# Patient Record
Sex: Female | Born: 1935 | Race: White | Hispanic: No | Marital: Married | Smoking: Former smoker
Health system: Southern US, Community
[De-identification: ages and names within clinical notes are randomized; demographics above are authoritative.]

## PROBLEM LIST (undated history)

## (undated) DIAGNOSIS — R413 Other amnesia: Secondary | ICD-10-CM

## (undated) DIAGNOSIS — I2510 Atherosclerotic heart disease of native coronary artery without angina pectoris: Secondary | ICD-10-CM

## (undated) DIAGNOSIS — F329 Major depressive disorder, single episode, unspecified: Secondary | ICD-10-CM

## (undated) DIAGNOSIS — H905 Unspecified sensorineural hearing loss: Secondary | ICD-10-CM

## (undated) DIAGNOSIS — L219 Seborrheic dermatitis, unspecified: Secondary | ICD-10-CM

## (undated) DIAGNOSIS — C50519 Malignant neoplasm of lower-outer quadrant of unspecified female breast: Secondary | ICD-10-CM

## (undated) DIAGNOSIS — I4891 Unspecified atrial fibrillation: Secondary | ICD-10-CM

## (undated) DIAGNOSIS — F32A Depression, unspecified: Secondary | ICD-10-CM

## (undated) DIAGNOSIS — E785 Hyperlipidemia, unspecified: Secondary | ICD-10-CM

## (undated) DIAGNOSIS — M159 Polyosteoarthritis, unspecified: Secondary | ICD-10-CM

## (undated) DIAGNOSIS — R350 Frequency of micturition: Secondary | ICD-10-CM

## (undated) DIAGNOSIS — G4700 Insomnia, unspecified: Secondary | ICD-10-CM

## (undated) DIAGNOSIS — G459 Transient cerebral ischemic attack, unspecified: Secondary | ICD-10-CM

## (undated) DIAGNOSIS — I10 Essential (primary) hypertension: Secondary | ICD-10-CM

## (undated) DIAGNOSIS — I219 Acute myocardial infarction, unspecified: Secondary | ICD-10-CM

## (undated) DIAGNOSIS — R32 Unspecified urinary incontinence: Secondary | ICD-10-CM

## (undated) DIAGNOSIS — G5600 Carpal tunnel syndrome, unspecified upper limb: Secondary | ICD-10-CM

## (undated) DIAGNOSIS — F419 Anxiety disorder, unspecified: Secondary | ICD-10-CM

## (undated) DIAGNOSIS — F22 Delusional disorders: Secondary | ICD-10-CM

## (undated) DIAGNOSIS — F0390 Unspecified dementia without behavioral disturbance: Secondary | ICD-10-CM

## (undated) DIAGNOSIS — C801 Malignant (primary) neoplasm, unspecified: Secondary | ICD-10-CM

## (undated) DIAGNOSIS — M810 Age-related osteoporosis without current pathological fracture: Secondary | ICD-10-CM

## (undated) DIAGNOSIS — R5383 Other fatigue: Secondary | ICD-10-CM

## (undated) DIAGNOSIS — R5381 Other malaise: Secondary | ICD-10-CM

## (undated) HISTORY — DX: Unspecified urinary incontinence: R32

## (undated) HISTORY — DX: Transient cerebral ischemic attack, unspecified: G45.9

## (undated) HISTORY — DX: Delusional disorders: F22

## (undated) HISTORY — PX: WRIST FRACTURE SURGERY: SHX121

## (undated) HISTORY — DX: Polyosteoarthritis, unspecified: M15.9

## (undated) HISTORY — DX: Other malaise: R53.81

## (undated) HISTORY — DX: Age-related osteoporosis without current pathological fracture: M81.0

## (undated) HISTORY — DX: Malignant neoplasm of lower-outer quadrant of unspecified female breast: C50.519

## (undated) HISTORY — DX: Seborrheic dermatitis, unspecified: L21.9

## (undated) HISTORY — DX: Acute myocardial infarction, unspecified: I21.9

## (undated) HISTORY — DX: Other amnesia: R41.3

## (undated) HISTORY — DX: Hyperlipidemia, unspecified: E78.5

## (undated) HISTORY — DX: Anxiety disorder, unspecified: F41.9

## (undated) HISTORY — DX: Unspecified sensorineural hearing loss: H90.5

## (undated) HISTORY — DX: Carpal tunnel syndrome, unspecified upper limb: G56.00

## (undated) HISTORY — DX: Frequency of micturition: R35.0

## (undated) HISTORY — DX: Depression, unspecified: F32.A

## (undated) HISTORY — DX: Major depressive disorder, single episode, unspecified: F32.9

## (undated) HISTORY — DX: Malignant (primary) neoplasm, unspecified: C80.1

## (undated) HISTORY — DX: Insomnia, unspecified: G47.00

## (undated) HISTORY — DX: Other fatigue: R53.83

## (undated) HISTORY — DX: Atherosclerotic heart disease of native coronary artery without angina pectoris: I25.10

---

## 1996-09-16 DIAGNOSIS — C50519 Malignant neoplasm of lower-outer quadrant of unspecified female breast: Secondary | ICD-10-CM

## 1996-09-16 HISTORY — DX: Malignant neoplasm of lower-outer quadrant of unspecified female breast: C50.519

## 1996-09-16 HISTORY — PX: MASTECTOMY: SHX3

## 1998-08-02 ENCOUNTER — Other Ambulatory Visit: Admission: RE | Admit: 1998-08-02 | Discharge: 1998-08-02 | Payer: Self-pay | Admitting: Emergency Medicine

## 1999-07-13 ENCOUNTER — Other Ambulatory Visit: Admission: RE | Admit: 1999-07-13 | Discharge: 1999-07-13 | Payer: Self-pay | Admitting: Emergency Medicine

## 2000-08-01 ENCOUNTER — Other Ambulatory Visit: Admission: RE | Admit: 2000-08-01 | Discharge: 2000-08-01 | Payer: Self-pay | Admitting: Emergency Medicine

## 2001-07-27 ENCOUNTER — Other Ambulatory Visit: Admission: RE | Admit: 2001-07-27 | Discharge: 2001-07-27 | Payer: Self-pay | Admitting: Emergency Medicine

## 2002-10-01 ENCOUNTER — Ambulatory Visit (HOSPITAL_COMMUNITY): Admission: RE | Admit: 2002-10-01 | Discharge: 2002-10-01 | Payer: Self-pay | Admitting: Emergency Medicine

## 2002-10-01 ENCOUNTER — Encounter: Payer: Self-pay | Admitting: Emergency Medicine

## 2002-11-08 ENCOUNTER — Encounter: Admission: RE | Admit: 2002-11-08 | Discharge: 2003-02-06 | Payer: Self-pay | Admitting: Emergency Medicine

## 2003-01-15 HISTORY — PX: CORONARY ARTERY BYPASS GRAFT: SHX141

## 2003-02-02 ENCOUNTER — Encounter: Payer: Self-pay | Admitting: Emergency Medicine

## 2003-02-02 ENCOUNTER — Inpatient Hospital Stay (HOSPITAL_COMMUNITY): Admission: EM | Admit: 2003-02-02 | Discharge: 2003-02-16 | Payer: Self-pay | Admitting: Emergency Medicine

## 2003-02-02 ENCOUNTER — Encounter: Payer: Self-pay | Admitting: Cardiology

## 2003-02-03 ENCOUNTER — Encounter: Payer: Self-pay | Admitting: Thoracic Surgery (Cardiothoracic Vascular Surgery)

## 2003-02-08 ENCOUNTER — Encounter: Payer: Self-pay | Admitting: Thoracic Surgery (Cardiothoracic Vascular Surgery)

## 2003-02-09 ENCOUNTER — Encounter: Payer: Self-pay | Admitting: Thoracic Surgery (Cardiothoracic Vascular Surgery)

## 2003-02-10 ENCOUNTER — Encounter: Payer: Self-pay | Admitting: Thoracic Surgery (Cardiothoracic Vascular Surgery)

## 2003-03-07 ENCOUNTER — Encounter (HOSPITAL_COMMUNITY): Admission: RE | Admit: 2003-03-07 | Discharge: 2003-06-05 | Payer: Self-pay | Admitting: Emergency Medicine

## 2003-05-20 ENCOUNTER — Encounter: Admission: RE | Admit: 2003-05-20 | Discharge: 2003-05-20 | Payer: Self-pay | Admitting: Emergency Medicine

## 2003-05-20 ENCOUNTER — Encounter: Payer: Self-pay | Admitting: Emergency Medicine

## 2003-06-06 ENCOUNTER — Encounter (HOSPITAL_COMMUNITY): Admission: RE | Admit: 2003-06-06 | Discharge: 2003-07-01 | Payer: Self-pay | Admitting: Cardiology

## 2003-06-12 ENCOUNTER — Observation Stay (HOSPITAL_COMMUNITY): Admission: EM | Admit: 2003-06-12 | Discharge: 2003-06-13 | Payer: Self-pay | Admitting: Emergency Medicine

## 2003-06-13 ENCOUNTER — Encounter: Payer: Self-pay | Admitting: Cardiology

## 2003-07-04 ENCOUNTER — Encounter (HOSPITAL_COMMUNITY): Admission: RE | Admit: 2003-07-04 | Discharge: 2003-10-02 | Payer: Self-pay | Admitting: Cardiology

## 2003-10-18 ENCOUNTER — Encounter (HOSPITAL_COMMUNITY): Admission: RE | Admit: 2003-10-18 | Discharge: 2004-01-16 | Payer: Self-pay | Admitting: Cardiology

## 2003-12-05 ENCOUNTER — Encounter: Admission: RE | Admit: 2003-12-05 | Discharge: 2003-12-05 | Payer: Self-pay | Admitting: Emergency Medicine

## 2003-12-19 ENCOUNTER — Encounter: Admission: RE | Admit: 2003-12-19 | Discharge: 2003-12-19 | Payer: Self-pay | Admitting: General Surgery

## 2003-12-22 ENCOUNTER — Encounter (INDEPENDENT_AMBULATORY_CARE_PROVIDER_SITE_OTHER): Payer: Self-pay | Admitting: *Deleted

## 2003-12-22 ENCOUNTER — Ambulatory Visit (HOSPITAL_BASED_OUTPATIENT_CLINIC_OR_DEPARTMENT_OTHER): Admission: RE | Admit: 2003-12-22 | Discharge: 2003-12-22 | Payer: Self-pay | Admitting: General Surgery

## 2003-12-22 ENCOUNTER — Ambulatory Visit (HOSPITAL_COMMUNITY): Admission: RE | Admit: 2003-12-22 | Discharge: 2003-12-22 | Payer: Self-pay | Admitting: General Surgery

## 2004-01-17 ENCOUNTER — Encounter (HOSPITAL_COMMUNITY): Admission: RE | Admit: 2004-01-17 | Discharge: 2004-04-16 | Payer: Self-pay | Admitting: Cardiology

## 2004-03-21 ENCOUNTER — Encounter (INDEPENDENT_AMBULATORY_CARE_PROVIDER_SITE_OTHER): Payer: Self-pay | Admitting: Cardiology

## 2004-03-21 ENCOUNTER — Ambulatory Visit (HOSPITAL_COMMUNITY): Admission: RE | Admit: 2004-03-21 | Discharge: 2004-03-21 | Payer: Self-pay | Admitting: Cardiology

## 2004-04-17 ENCOUNTER — Encounter (HOSPITAL_COMMUNITY): Admission: RE | Admit: 2004-04-17 | Discharge: 2004-07-16 | Payer: Self-pay | Admitting: Cardiology

## 2004-07-17 ENCOUNTER — Encounter (HOSPITAL_COMMUNITY): Admission: RE | Admit: 2004-07-17 | Discharge: 2004-10-15 | Payer: Self-pay | Admitting: Cardiology

## 2004-08-08 ENCOUNTER — Encounter: Admission: RE | Admit: 2004-08-08 | Discharge: 2004-08-08 | Payer: Self-pay | Admitting: Emergency Medicine

## 2004-09-16 HISTORY — PX: KNEE ARTHROSCOPY: SHX127

## 2004-10-17 ENCOUNTER — Encounter (HOSPITAL_COMMUNITY): Admission: RE | Admit: 2004-10-17 | Discharge: 2005-01-15 | Payer: Self-pay | Admitting: Cardiology

## 2004-12-20 ENCOUNTER — Encounter: Admission: RE | Admit: 2004-12-20 | Discharge: 2004-12-20 | Payer: Self-pay | Admitting: Orthopedic Surgery

## 2004-12-21 ENCOUNTER — Ambulatory Visit (HOSPITAL_BASED_OUTPATIENT_CLINIC_OR_DEPARTMENT_OTHER): Admission: RE | Admit: 2004-12-21 | Discharge: 2004-12-21 | Payer: Self-pay | Admitting: Orthopedic Surgery

## 2004-12-21 ENCOUNTER — Ambulatory Visit (HOSPITAL_COMMUNITY): Admission: RE | Admit: 2004-12-21 | Discharge: 2004-12-21 | Payer: Self-pay | Admitting: Orthopedic Surgery

## 2004-12-25 ENCOUNTER — Ambulatory Visit (HOSPITAL_COMMUNITY): Admission: RE | Admit: 2004-12-25 | Discharge: 2004-12-25 | Payer: Self-pay | Admitting: Professional

## 2005-04-16 ENCOUNTER — Encounter (HOSPITAL_COMMUNITY): Admission: RE | Admit: 2005-04-16 | Discharge: 2005-07-15 | Payer: Self-pay | Admitting: Cardiology

## 2005-04-30 ENCOUNTER — Encounter: Admission: RE | Admit: 2005-04-30 | Discharge: 2005-04-30 | Payer: Self-pay | Admitting: Emergency Medicine

## 2005-07-17 ENCOUNTER — Encounter (HOSPITAL_COMMUNITY): Admission: RE | Admit: 2005-07-17 | Discharge: 2005-10-15 | Payer: Self-pay | Admitting: Cardiology

## 2005-10-17 ENCOUNTER — Encounter (HOSPITAL_COMMUNITY): Admission: RE | Admit: 2005-10-17 | Discharge: 2006-01-15 | Payer: Self-pay | Admitting: Cardiology

## 2006-01-16 ENCOUNTER — Encounter (HOSPITAL_COMMUNITY): Admission: RE | Admit: 2006-01-16 | Discharge: 2006-04-16 | Payer: Self-pay | Admitting: Cardiology

## 2006-04-17 ENCOUNTER — Encounter (HOSPITAL_COMMUNITY): Admission: RE | Admit: 2006-04-17 | Discharge: 2006-07-16 | Payer: Self-pay | Admitting: Cardiology

## 2006-09-16 HISTORY — PX: TOTAL HIP ARTHROPLASTY: SHX124

## 2006-10-27 ENCOUNTER — Inpatient Hospital Stay (HOSPITAL_COMMUNITY): Admission: RE | Admit: 2006-10-27 | Discharge: 2006-10-30 | Payer: Self-pay | Admitting: Orthopedic Surgery

## 2007-01-16 ENCOUNTER — Encounter: Admission: RE | Admit: 2007-01-16 | Discharge: 2007-01-16 | Payer: Self-pay | Admitting: Emergency Medicine

## 2008-01-26 ENCOUNTER — Encounter: Admission: RE | Admit: 2008-01-26 | Discharge: 2008-01-26 | Payer: Self-pay | Admitting: Orthopedic Surgery

## 2008-01-27 ENCOUNTER — Ambulatory Visit (HOSPITAL_BASED_OUTPATIENT_CLINIC_OR_DEPARTMENT_OTHER): Admission: RE | Admit: 2008-01-27 | Discharge: 2008-01-27 | Payer: Self-pay | Admitting: Orthopedic Surgery

## 2008-01-29 ENCOUNTER — Inpatient Hospital Stay (HOSPITAL_COMMUNITY): Admission: EM | Admit: 2008-01-29 | Discharge: 2008-01-30 | Payer: Self-pay | Admitting: Emergency Medicine

## 2008-01-29 ENCOUNTER — Encounter (INDEPENDENT_AMBULATORY_CARE_PROVIDER_SITE_OTHER): Payer: Self-pay | Admitting: Internal Medicine

## 2008-06-07 LAB — HM DEXA SCAN

## 2010-09-16 DIAGNOSIS — R413 Other amnesia: Secondary | ICD-10-CM

## 2010-09-16 DIAGNOSIS — I219 Acute myocardial infarction, unspecified: Secondary | ICD-10-CM

## 2010-09-16 HISTORY — DX: Acute myocardial infarction, unspecified: I21.9

## 2010-09-16 HISTORY — DX: Other amnesia: R41.3

## 2010-10-06 ENCOUNTER — Encounter: Payer: Self-pay | Admitting: Family Medicine

## 2010-12-17 LAB — HM MAMMOGRAPHY: HM Mammogram: NEGATIVE

## 2011-01-29 NOTE — Op Note (Signed)
[name], [name]               ACCOUNT NO.:  [id number]   MEDICAL RECORD NO.:  [id number]          PATIENT TYPE:  AMB   LOCATION:  DSC                          FACILITY:  [facility]   PHYSICIAN:  Artist Pais. Weingold, M.D.DATE OF BIRTH:  Nov 27, 1922   DATE OF PROCEDURE:  DATE OF DISCHARGE:                               OPERATIVE REPORT   PREOPERATIVE DIAGNOSIS:  Displaced intra-articular fracture, right  distal radius with mild carpal tunnel syndrome.   POSTOPERATIVE DIAGNOSIS:  Displaced intra-articular fracture, right  distal radius with mild carpal tunnel syndrome.   PROCEDURE:  Open reduction and internal fixation above using DVR plate  and screws and carpal tunnel release.   SURGEON:  Artist Pais. Mina Marble, M.D.   ASSISTANT:  None.   ANESTHESIA:  Axial block.   TOURNIQUET TIME:  43 minutes.   COMPLICATIONS:  None.   DRAINS:  None.   OPERATIVE PROCEDURE:  The patient was taken to the operating suite,  after induction of adequate axial block analgesia and mild IV sedation,  the right upper extremity was prepped and draped in usual sterile  fashion.  An Esmarch was used to exsanguinate limb and a tourniquet was  inflated to 250 mmHg.  At this point in time, an incision was made over  the palmar aspect of the distal forearm and wrist area over the flexor  carpi radialis tendon.  Skin was incised.  The sheath overlying the FCR  was incised.  The FCR extracted in the midline, radial artery and venae  comitantes to the lateral side.  Interval was developed down the level  of pronator quadratus.  Pronator quadratus subperiosteally stripped off  the distal radius using a 15 blade exposing the fracture site.  The  first dorsal compartment and brachial radialis were carefully released  off the radial aspect of the shaft and distal fragment.  Reduction was  then performed using manual traction, flexion, and ulnar deviation.  Once this was done, the DVR plate was fastened to the volar  aspect of  the distal radius through the slotted hole.  Intraoperative fluoroscopy  revealed adequate reduction both the AP lateral and oblique view.  The  remaining cortical screws were placed proximally followed by smooth pegs  distally.  At the end of procedure, intraoperative fluoroscopy revealed  adequate reduction both the AP lateral and oblique view.  The proximal  aspect of carpal tunnel was then carefully identified.  A Freer elevator  was then used to determine the depth of the carpal tunnel.  This was  then followed by placement of a Senn retractor in the apex of the wound  lifting up the palm of the hand.  The transverse carpal wound as then  divided under direct vision, just decompressing the nerve, the wound was  irrigated thoroughly.  Hemostasis  achieved with bipolar cautery and the wound was then loosely closed in  layers of 0 Vicryl to repair the pronator quadratus followed by 3-0  Prolene subcuticular stitch on the skin.  Steri-Strips, 4 x 4s, fluffs  and a volar splint was applied.  The patient tolerated the procedures  well, went to recovery room in stable fashion.      Artist Pais Mina Marble, M.D.  Electronically Signed     MAW/MEDQ  D:  01/27/2008  T:  01/28/2008  Job:  [id number]

## 2011-01-29 NOTE — Discharge Summary (Signed)
[name], [name]               ACCOUNT NO.:  [id number]   MEDICAL RECORD NO.:  [id number]          PATIENT TYPE:  INP   LOCATION:  4702                         FACILITY:  [facility]   PHYSICIAN:  Eduard Clos, MDDATE OF BIRTH:  September 24, 1922   DATE OF ADMISSION:  01/29/2008  DATE OF DISCHARGE:  01/30/2008                               DISCHARGE SUMMARY   This is an 77 year old female with known history of CAD status post  CABG, asthma, atrial fibrillation, dyslipidemia, hypertension who had  recent right radius ORIF presented with palpitation.  The patient was  found to have atrial fibrillation with rapid ventricular rate was  admitted  to telemetry floor.  The patient also in addition was found to  have mild elevated troponin.  The patient was admitted to telemetry  floor on started on IV Cardizem.  In addition, p.o. metoprolol was  started.  Once heart rate was controlled, IV Cardizem was tapered off.   Cardiology consult was obtained with Dr. Donnie Aho.  The patient also had  2D echo done today, which showed ejection fraction 60% with no wall  motion abnormalities.  Per Dr. Donnie Aho, troponin is most likely related  to rapid atrial fibrillation.  The patient's atrial fibrillation has  reverted back to sinus rhythm and rate was controlled.  Presently, the  patient is asymptomatic as per cardiology, no further work up, and  follow up with Dr. Donnie Aho as outpatient.  During the stay, the patient  also had chest x-ray, which showed left middle lung small density, for  which adequate chest x- ray has to be done in 2-3 weeks, which I have  advised to the patient and the patient's family.  This has to be done  with primary care physician.  At the time of discharge, the patient is  hemodynamically stable.   FINAL DIAGNOSES:  1. Atrial fibrillation with rapid ventricular rate.  Normal sinus      rhythm.  2. Coronary artery disease, status post coronary artery bypass graft.  3.  Hypertension.  4. Hyperlipidemia.   MEDICATION AT DISCHARGE:  1. Metoprolol 10 mg p.o. b.i.d.  2. Aspirin 81 mg p.o. daily.  3. Plavix 75 mg p.o. daily.  4. Celexa and Lovaza as taken at home.   PLAN:  The patient was advised to follow up with her primary care  physician within 3 days to follow with cardiology Dr. Donnie Aho within a  weeks time.  The patient is to be on a cardiac healthy diet with fall  precautions.  The patient will need a repeat chest x-ray within 2-3  weeks to reassess her left middle lung density.      Eduard Clos, MD  Electronically Signed     ANK/MEDQ  D:  01/30/2008  T:  01/31/2008  Job:  [id number]

## 2011-01-29 NOTE — Consult Note (Signed)
[name], [name]               ACCOUNT NO.:  [id number]   MEDICAL RECORD NO.:  [id number]          PATIENT TYPE:  INP   LOCATION:  4702                         FACILITY:  [facility]   PHYSICIAN:  Georga Hacking, M.D.DATE OF BIRTH:  22-Nov-1922   DATE OF CONSULTATION:  01/29/2008  DATE OF DISCHARGE:                                 CONSULTATION   I was asked see this 77 year old female for evaluation of atrial  fibrillation.  The patient has a history of coronary artery disease with  previous bypass grafting in 2004.  She has had paroxysmal atrial  fibrillation since then.  She has moderate malaise and fatigue that is  age-related and has no cardiac symptoms normally.  She has controlled  blood pressure, but had cut her metoprolol down on her own previously  from twice a day to once a day.  She fell 4 days ago and fractured her  right wrist and on Wednesday, she had operative fixation with a plate  placed in the right wrist by Dr. Mina Marble.  She was given tramadol and  has had some confusion since then.  This morning, she developed rapid  heart beat without chest pain or shortness of breath and was brought by  EMS to the emergency room, where she was found to be in atrial  fibrillation.  She was treated with diltiazem and has had one set of  enzymes which have been positive with a troponin of 0.2 and an MB of 6.  She has had no chest pain.  She is currently symptom free and reverted  back to sinus rhythm and sinus bradycardia now.   PAST HISTORY:  1. Hypertension.  2. Hyperlipidemia.  3. History of breast cancer, treated with surgery.  4. A TIA in 2004.  5. A history of strep empyema in 1985, with a prolonged      hospitalization, treated with chest tubes.  6. History of depression.   PAST SURGICAL HISTORY:  1. Bypass grafting.  2. Arthroscopic right knee surgery.  3. Left mastectomy.  4. Right hip replacement.   ALLERGIES/INTOLERANCES:  SHE IS INTOLERANT TO CRESTOR, LIPITOR  AND ZOCOR  AND HAS ALLERGIES TO SULFONAMIDES.  SHE IS INTOLERANT TO CARDIZEM WITH  NOCTURIA AND EDEMA.   FAMILY HISTORY:  Unremarkable and is not contributory.   SOCIAL HISTORY:  Lives with her elderly husband.  Used to smoke, but  quit prior to 1980.  She has a history of Halcion addiction in the past.   REVIEW OF SYSTEMS:  She has a history of TIA and has been on both Plavix  and aspirin.  She has a history of depression, anxiety, sleep  disturbance.  She has significant arthritis.  She has partial hearing  loss and a history of skin cancers.   PHYSICAL EXAMINATION:  GENERAL:  She is an elderly woman who appeared  mildly confused, but knew me and was oriented to place and time.  VITAL SIGNS:  Blood pressure was 120/76, pulse was 60 and regular.  SKIN:  Warm and dry with significant ecchymoses noted with her right  arm.  Her right arm is bandaged and is in a cast.  HEENT:  EOMI.  PERRLA.  CNS:  Clear.  LUNGS:  Clear.  CARDIAC:  Showed normal S1-S2, no S3.  ABDOMEN:  Soft and nontender.  EXTREMITIES:  There is no edema noted.   Her troponin was 0.2, MB was 6.  EKG initially showed atrial  fibrillation with a somewhat rapid response.   IMPRESSION:  1. Paroxysmal atrial fibrillation, which she has had in the past and      has been treated in the past with amiodarone.  She has not had any      atrial fibrillation in some time.  She has currently reverted back      to sinus rhythm.  I would not consider her to be a good candidate      for Coumadin at this time because of the recent surgery and the      significant ecchymosis and bruising that she has.  Since atrial      fibrillation was of short duration and also related to the      reduction in her beta-blocker dosage, I would have her increase her      beta-blocker dosage and resume her Plavix that was stopped a couple      of days ago, continue aspirin.  2. Abnormal cardiac enzymes, likely related to the recent atrial       fibrillation.  I would get serial enzymes and as long as it was not      rise significantly, I think she could probably be discharged in the      morning.  3. Coronary artery disease with previous bypass grafting, previously      not symptomatic.   RECOMMENDATIONS:  Higher dose of beta blockers.  I think 50 twice a day  is reasonable, but if she develops severe bradycardia on this, may need  to reduce the dose.  Stop diltiazem at this time.  Treat with aspirin,  Plavix, cycle enzymes.      Georga Hacking, M.D.  Electronically Signed     WST/MEDQ  D:  01/29/2008  T:  01/29/2008  Job:  [id number]   cc:   Reuben Likes, M.D.

## 2011-01-29 NOTE — H&P (Signed)
Wise, [name]               ACCOUNT NO.:  [id number]   MEDICAL RECORD NO.:  [id number]          PATIENT TYPE:  INP   LOCATION:  4702                         FACILITY:  [facility]   PHYSICIAN:  Hind I Elsaid, MD      DATE OF BIRTH:  Mar 23, 1923   DATE OF ADMISSION:  01/29/2008  DATE OF DISCHARGE:                              HISTORY & PHYSICAL   PRIMARY CARE PHYSICIAN:  Reuben Likes, MD.   PRIMARY CARDIOLOGIST:  Viann Fish, MD.   CHIEF COMPLAINT:  Increased heart racing.   HISTORY OF PRESENT ILLNESS:  This is an 77 year old Caucasian female  with history of paroxysmal AFib, hypertension, coronary artery disease,  status post bypass surgery on 2004.  Also, this patient is status post  intraarticular fracture of the right distal radius with mild carpal  tunnel syndrome, status post open reduction and internal fixation on Jan 27, 2008.  Admitted today with chief complaint of increased heart  racing.  Apparently, the patient went early morning to the bathroom, and  she could not be able to stand up after using the bathroom.  At that  time, the husband helped the patient to return back to her room.  The  patient was frustrated with inability to do that.  When sister arrived,  the patient arrived the patient complained of almost about to faint and  complained of increased heart racing.  The patient denies any chest pain  or shortness of breath.  The patient denies any nausea, vomiting, or  abdominal pain.  Denies any slurred speech.  Denies any lower extremity  or upper extremity weakness or numbness.   PAST MEDICAL HISTORY:  1. Coronary artery disease, status post bypass.  2. Hypertension.  3. Paroxysmal AFib.  4. Dyslipidemia.  5. Coronary artery disease, status post bypass surgery.  6. History of mastectomy, secondary to breast cancer.  7. History of TIA.  8. History of right hip replacement.  9. Open reduction and internal fixation of right distal radius on Jan 27, 2008.  10.Bilateral streptococcal empyema.   MEDICATIONS:  1. Aspirin 81 mg.  2. Plavix 75 mg.  3. Tramadol.  4. Celexa.  5. Lovaza.  6. Metoprolol, dose unknown.   The patient was apparently asked to resume her medications after the  procedure.   ALLERGIES:  SULFA.   PAST SURGICAL HISTORY:  1. Left mastectomy in 1998.  2. Bypass surgery.  3. Bilateral streptococcal empyema requiring bilateral chest tubes.  4. Hip replacement.  5. ORIF of the right wrist.   FAMILY HISTORY:  Positive for coronary artery disease.  Her father had  heart attack in his 31s and died at 60 of chronic heart failure.   SOCIAL HISTORY:  She quit smoking long time ago more than 50 years ago.  She drinks alcohol occasionally, but she is not a heavy drinker.  She is  married and has 2 daughters.  Her husband is almost 32 year old.  They  live together.   REVIEW OF SYSTEMS:  The patient denies any headache.  Denies any  numbness or weakness.  Denies  any dysarthria or drooping of saliva.  The  patient complains of numbness on her right secondary to the cast.  Denies any chest pain, shortness of breath.  Denies any nausea,  vomiting, abdominal pain.  Denies any hematuria.   PHYSICAL EXAMINATION:  VITAL SIGNS:  Temperature 97, blood pressure  118/68, pulse rate 160, respiratory rate 22, oxygen saturation 95% on  room air.  HEENT:  Normocephalic and atraumatic.  Pupils are equal, round, and  reactive to light and accommodation.  There is no evidence of facial  droop.  HEART:  S1 and S2.  Irregularly irregular.  No evidence of gallop.  BREASTS:  Left breast is absent.  LUNGS:  There is mild rales at the bases.  ABDOMEN:  Soft, nontender without organomegaly or masses.  EXTREMITIES:  Peripheral pulses intact.  There is no evidence of edema.  CNS:  The patient is alert and oriented x3, with no focal neurological  finding.  SKIN:  Some dry scales on her lower extremities.   EKG, AFib with RVR.   Blood workup showed BMET of 134, potassium 3.7,  chlorine 99, CO2 26, glucose 114, BUN 21, creatinine 0.7.  CBC, white  blood cells 10.1, hemoglobin 14, hematocrit 40.3, and platelets 198.  Chest x-ray showed enlargement of interstitial markings.  For this  interstitial edema with vascular condition seated with density in the  left medial lung that could be related to edema but indeterminate,  recommend follow up.   ASSESSMENT:  1. Atrial fibrillation with rapid ventricular response.  The patient      will continue with her Cardizem, and the Cardizem drip will be      switched to Cardizem.  We will continue his metoprolol 50 mg by      mouth every 6 hours if blood pressure allow.  We will continue his      aspirin.  The patient is not a Coumadin candidate secondary to      falls.  We will consult Dr. Viann Fish, her cardiologist.  We      will get 2D echo and cardiac enzymes.  2. Coronary artery disease, status post bypass surgery, seems to      stable.  The patient denies any chest pain.  3. Vague density on chest x-ray.  Possibility of dermal edema.  We      will repeat chest x-ray, AP and lateral view.  We did BMP and 2D      echo.  A small dose of Lasix would be started.  4. Right wrist fracture, status post cast.  We will monitor.  The      patient has a recent surgery, risk of bleeding is      known.  We will continue his aspirin and deep vein thrombosis      prophylaxis.  Today is day #3 of surgery.  Deep vein thrombosis and      gastrointestinal prophylaxis.   Further recommendation to be addressed as hospital course progresses.      Hind Bosie Helper, MD  Electronically Signed     HIE/MEDQ  D:  01/29/2008  T:  01/30/2008  Job:  [id number]

## 2011-02-01 NOTE — Op Note (Signed)
[name], [name]                           ACCOUNT NO.:  [id number]   MEDICAL RECORD NO.:  [id number]                   PATIENT TYPE:  INP   LOCATION:                                       FACILITY:  [facility]   PHYSICIAN:  Salvatore Decent. Cornelius Moras, M.D.              DATE OF BIRTH:  [birth year]   DATE OF PROCEDURE:  02/08/2003  DATE OF DISCHARGE:                                 OPERATIVE REPORT   PREOPERATIVE DIAGNOSIS:  Severe three vessel coronary artery disease with  class 4 unstable angina.   POSTOPERATIVE DIAGNOSIS:  Severe three vessel coronary artery disease with  class 4 unstable angina.   PROCEDURE:  Median sternotomy for coronary artery bypass grafting times four  (left internal mammary artery to distal left anterior descending coronary  artery, saphenous vein graft to ramus intermediate branch, saphenous vein  graft to left posterior descending artery and sequential saphenous vein  graft to left posterolateral branch).   SURGEON:  Salvatore Decent. Cornelius Moras, M.D.   ASSISTANT:  Coral Ceo, P.A.   ANESTHESIA:  General.   BRIEF CLINICAL NOTE:  The patient is a 77 year old female from [place]  followed by Dr. Leslee Home and referred by Dr. Geralynn Rile for management  of coronary artery disease.  The patient has a history of bilateral empyema  in the distant past as well as a previous left mastectomy for breast cancer.  She has a history of hypertension and hyperlipidemia.  She presents with  progressive symptoms of exertional shortness of breath and atypical pain in  both arms that developed acutely on May 19 and was felt to be consistent  with unstable angina.  Catheterization performed by Dr. Samule Ohm demonstrated  severe two vessel coronary artery disease with normal left ventricular  function.  A full consultation has been dictated previously.   OPERATIVE CONSENT:  The patient and her husband have been counseled at  length regarding the indications and potential benefits of  coronary artery  bypass grafting.  Alternative treatment strategies have been discussed.  They understand and accept all associated risks of surgery, including but  not limited to, risk of death, stroke, myocardial infarction, bleeding  requiring blood transfusion, arrhythmia, infection and recurrent coronary  artery disease.   OPERATIVE NOTE IN DETAIL:  The patient was brought to the operating room on  the above mentioned date and central monitoring was established by the  anesthesia service under the care and direction of Dr. Jean Rosenthal.  Specifically, a Swan-Ganz catheter was placed through the left internal  jugular approach.  A radial arterial line was placed.  Intravenous  antibiotics are administered.  Following induction with general endotracheal  anesthesia, a Foley catheter was placed. The patient's chest, abdomen, both  groins and both lower extremities were prepared and draped in a sterile  manner.   A median sternotomy incision was performed and the left internal mammary  artery is  dissected from the chest wall and prepared for bypass grafting.  The left internal mammary artery is small caliber but good quality conduit  and has excellent forward flow.  Simultaneously saphenous vein was obtained  from the patient's right thigh using endoscopic vein harvest technique.  The  saphenous vein is a good quality conduit although medium caliber in size.  The patient was heparinized systemically.   The pericardium was opened.  The ascending aorta is normal in appearance.  The ascending aorta and the right atrium are cannulated for cardiopulmonary  bypass.  Adequate heparinization is verified.  Cardiopulmonary bypass is  begun and the surface of the heart is inspected.  Distal sites are selected  for coronary bypass grafting.  Portions of the saphenous vein and the left  internal mammary artery are trimmed to the appropriate length.  There is  diffuse palpable and visible coronary  arteries involving all of the  epicardial coronary vessels.  The left ventricle appears essentially normal.  A cardioplegia catheter is placed in the ascending aorta.  A temperature  probe was placed in the left ventricular septum.   The patient is cooled to 32 degrees systemic temperature.  The aortic cross  clamp is applied and cardioplegia is delivered in an antegrade fashion  through the aortic root.  Ice saline flush is applied for topical  hypothermia.  The initial cardioplegia arrest and myocardial cooling are  felt to be satisfactory.  Repeat doses of cardioplegia are administered  intermittently throughout the cross clamp portion of the operation both  through the aortic root and down the subsequently placed vein grafts to  maintain septal temperature below 15 degrees Centigrade.  The following  distal coronary anastomoses are performed:  1. The posterior descending artery which is a distal branch off the distal     left circumflex coronary artery is grafted with a saphenous vein graft in     a side to side fashion.  This coronary measured 1.2 cm in diameter and is     of fair quality.  2. The posterolateral branch off the distal left circumflex coronary artery     is grafted using a sequential saphenous vein graft off of the vein placed     to the posterior descending coronary artery.  This vessel measures 1.3 mm     at the site of distal bypass and is of good quality.  3. The ramus intermediate branch is grafted with a saphenous vein graft in     an end to side fashion. This coronary measures 1.5 mm in diameter and is     of good quality at the site of distal bypass.  It is diffusely diseased     proximally.  4. The distal left anterior descending coronary artery is grafted with the     left internal mammary artery in an end to side fashion.  This coronary    measured 1.2 mm at the site of distal bypass and is of good quality.  The     distal anastomosis is far down the  anterior wall of the left ventricle     due to diffuse disease proximally in the vessel.  There are no sites more     proximal which are graftable.  Both proximal saphenous vein anastomoses     are performed directly to the ascending aorta prior to removal of the     aortic cross clamp.  The septal temperature is noted to rise rapidly and  dramatically upon re-perfusion of the left internal mammary artery.  The     aortic cross clamp is removed after a total cross clamp time of 63     minutes.   The heart begins to beat spontaneously without the need for cardioversion.  All proximal and distal anastomoses are inspected for hemostasis and  appropriate graft orientation.  Epicardial pacing wires are fixed to the  right ventricular outflow tract and to the right atrial appendage.  The  patient is re-warmed to 37 degrees Centigrade temperature.  The patient is  weaned from cardiopulmonary bypass without difficulty.  The patient's rhythm  at separation from bypass is sinus rhythm.  Atrial pacing is employed to  increase the heart rate.  Total cardiopulmonary bypass time for the  operation is 83 minutes.  No inotropic support is required.   The venous and arterial cannulae are removed uneventfully. Protamine is  administered to reverse the anticoagulation.  The mediastinum and the left  chest are irrigated with saline solution containing vancomycin.  Of note,  the majority of the left pleural space is obliterated due to adhesions  surrounding the left lung.  The mediastinum and the medial portion of the  left pleural space are drained with two chest tubes placed at separate stab  incisions inferiorly.  The median sternotomy is closed in a routine fashion.  The right lower extremity incision is closed in multiple layers in routine  fashion. All skin incisions are closed with subcuticular skin closures.   The patient tolerated the procedure well and is transported to the surgical  intensive  care unit in stable condition.  There were no intraoperative  complications.  All sponge, instrument and needle counts are verified  correct at the completion of the operation.  No blood products were  administered.                                               Salvatore Decent. Cornelius Moras, M.D.    CHO/MEDQ  D:  02/08/2003  T:  02/08/2003  Job:  [id number]   cc:   Reuben Likes, M.D.  [address]  [place]  [place] [address]  Fax: [phone]   Salvadore Farber, M.D.   Veneda Melter, M.D.

## 2011-02-01 NOTE — Op Note (Signed)
[name], [name]               ACCOUNT NO.:  [id number]   MEDICAL RECORD NO.:  [id number]          PATIENT TYPE:  AMB   LOCATION:  DSC                          FACILITY:  [facility]   PHYSICIAN:  Harvie Junior, M.D.   DATE OF BIRTH:  [birth year]   DATE OF PROCEDURE:  12/21/2004  DATE OF DISCHARGE:                                 OPERATIVE REPORT   PREOPERATIVE DIAGNOSIS:  Medial and lateral meniscal tear.   POSTOPERATIVE DIAGNOSIS:  1.  Medial and lateral meniscal tear.  2.  Chondromalacia of the lateral femoral condyle.  3.  Chondromalacia of the patellofemoral joint.   PROCEDURE:  1.  Partial medial meniscectomy.  2.  Partial lateral meniscectomy.  3.  Debridement of chondromalacia of the lateral femoral condyle.  4.  Debridement of chondromalacia of the patellofemoral joint, in      particular, the trochlea.   SURGEON:  Harvie Junior, M.D.   ASSISTANT:  Marshia Ly, P.A.-C.   ANESTHESIA:  Knee block with MAC.   BRIEF HISTORY:  77 year old female with a long history of having right knee  pain.  She ultimately had somewhat of a valgus knee.  She has an arthritic  hip and arthritic knee, some bone on bone changes laterally  radiographically.  She ultimately was living with it, an injection therapy  had helped, but she wanted something more done but was not really ready to  commit to joint replacement.  We talked about treatment options including  addressing the hip first.  Ultimately, she elected to undergo operative knee  arthroscopy and we certainly felt this has a reasonable chance of giving her  some improvement.  She is brought to the operating room for this procedure.   DESCRIPTION OF PROCEDURE:  The patient was taken to the operating room and  after adequate anesthesia was obtained with a knee block and MAC, the  patient was placed on the operating table, the right leg was prepped and  draped in the usual sterile fashion.  Following this, routine arthroscopic  examination of the knee revealed there was obvious posterior horn medial  meniscal tear which was debrided with straight biting forceps back to a  smooth and stable rim.  Attention was turned laterally where the meniscus  was catching laterally which was debrided back to a smooth and stable rim.  Attention was turned to the posterior aspect where the lateral tibial  plateau had some grade 3 and grade 4 change.  The lateral femoral condyle  had some grade 3 and 4 change which was debrided.  Attention was turned to  the patellofemoral joint where there was some lateral patellar tracking  owing to the valgus tendency of the leg and the patellofemoral joint was  debrided, particularly the trochlea.  Grade 3 changes were seen.  The knee  was then copiously irrigated with 6 liters normal saline irrigation and  suctioned dry.  The portals were closed with a bandage.  A sterile  compressive dressing was applied.  The patient was taken to the recovery  room and was noted to be in satisfactory  condition.  Estimated blood loss  was nothing.      JLG/MEDQ  D:  12/21/2004  T:  12/21/2004  Job:  [id number]

## 2011-02-01 NOTE — Op Note (Signed)
[name], [name]                           ACCOUNT NO.:  [id number]   MEDICAL RECORD NO.:  [id number]                   PATIENT TYPE:  AMB   LOCATION:  DSC                                  FACILITY:  [facility]   PHYSICIAN:  Rose Phi. Young, M.D.                DATE OF BIRTH:  10-30-1922   DATE OF PROCEDURE:  12/22/2003  DATE OF DISCHARGE:                                 OPERATIVE REPORT   PREOPERATIVE DIAGNOSIS:  Abnormal right breast mammogram.   POSTOPERATIVE DIAGNOSIS:  Abnormal right breast mammogram.   OPERATION:  Right breast biopsy with needle localization and specimen  mammography.   SURGEON:  Rose Phi. Maple Hudson, M.D.   ANESTHESIA:  MAC.   OPERATIVE PROCEDURE:  The patient was placed on the operating table with the  arms extended on the armboard.  The right breast was prepped and draped in  the usual fashion.  A curved incision centered at about the 5 o'clock  position was made using the previously-placed wire as a guide.  I then  exposed the wire and delivered it into the incision, and then did a wide  excision of the wire and surrounding tissue.  Specimen mammography confirmed  the removal of the lesion.  Hemostasis was obtained with the cautery.  Subcuticular closure of 4-0 Monocryl and Steri-Strips carried out.  Dressing  applied.  The patient transferred to the recovery room in satisfactory  condition having tolerated the procedure well.                                               Rose Phi. Maple Hudson, M.D.    PRY/MEDQ  D:  12/22/2003  T:  12/22/2003  Job:  [id number]

## 2011-02-01 NOTE — Discharge Summary (Signed)
[name], [name]               ACCOUNT NO.:  [id number]   MEDICAL RECORD NO.:  [id number]          PATIENT TYPE:  INP   LOCATION:  5025                         FACILITY:  [facility]   PHYSICIAN:  Elana Alm. Thurston Hole, M.D. DATE OF BIRTH:  06/04/1923   DATE OF ADMISSION:  10/27/2006  DATE OF DISCHARGE:  10/30/2006                               DISCHARGE SUMMARY   ADMITTING DIAGNOSES:  1. End-stage degenerative joint disease right hip.  2. Coronary artery disease.  3. History of paroxysmal atrial fibrillation.  4. History of bilateral strep empyemas.  5. History of breast cancer.  6. Hypertension.  7. High cholesterol.   DISCHARGE DIAGNOSES:  1. End-stage degenerative joint disease right hip status post total      hip replacement.  2. Hyponatremia.  3. Urinary incontinence.  4. Confusion that has resolved.  5. Hard of hearing.  6. Visual deficit corrected by glasses.  7. Coronary artery disease status post coronary artery bypass      grafting.  8. History of strep empyema.  9. History of breast cancer.   HISTORY OF PRESENT ILLNESS:  The patient is an 77 year old white female  with a history of end-stage degenerative joint disease of her right hip.  She has failed conservative care including anti-inflammatories and  intraarticular hip injections. She has pain at rest, pain at night, pain  unrelieved by anti-inflammatories and Ultram. She understands the risks,  benefits, and possible complications of a right total hip replacement  and is without question.   PROCEDURES IN-HOUSE:  On October 27, 2006 the patient underwent a right  total hip replacement by Dr. Thurston Hole. She was admitted postoperatively  for pain control, DVT prophylaxis, and physical therapy. Post Coumadin  and Lovenox were started postoperatively for DVT prophylaxis. This is  along with her Plavix and low dose aspirin. Postop day #1 she had  decreased urinary output. Was given a 500 cc bolus of fluid. She had no  decrease in renal function by her lab work. She walked 16 feet with  physical therapy. The night of postop day #1 had significant difficulty  with personality change, confusion, and agitation as well as aggression.  Her Ambien was stopped. Her OxyIR was stopped. She was placed on Tylenol  325 mg two tablets q.4 h. around the clock with Ultram (Tramadol 50 mg 1-  2 q.4 h. p.r.n. breakthrough pain). Postop day #2 her pulse was 105,  hemoglobin 10.1, INR 1.3. Urinary output was increased. She did have  difficulty with daytime incontinence as well as her previous history of  night time incontinence after her Foley was removed. Therefore  urinalysis was sent. Urinalysis did show some ketones but no bacteria,  no white cells, and no nitrites. Postop day #2 in the morning she walked  18 feet with physical therapy with many verbal cues, moderate assist.  Postop day #3 the patient is alert, oriented. Had an excellent night.  Still has difficulty with incontinence. Her surgical wound is well-  approximated and healing. Her hemoglobin is 10.4. Her white cells  continue to drift down at 13.3. Her INR  is subtherapeutic at 1.6. Sodium  133, potassium 4.2, chloride 101, CO2 22, glucose 109, BUN 13,  creatinine 0.4. She is being discharged to [facility] skilled nursing  facility; 50% weightbearing on her right leg. She is on a regular diet.  She is weightbearing. She needs daily dressing changes.   DISCHARGE MEDICATIONS:  1. Aspirin 81 mg daily.  2. Plavix 75 mg daily.  3. Celexa 10 mg daily.  4. Toprol XL 25 mg daily.  5. Detrol LA 2 mg p.o. q.h.s.  6. Zetia 10 mg q.h.s.  7. Multivitamin one tablet daily.  8. Tylenol 325 mg two tablets q.4 h. scheduled.  9. Coumadin 6 mg.  10.Colace 100 mg twice a day.  11.Senokot-S two tablets before dinner if no BM in two days.  12.Tramadol 50 mg 1-2 tablets q.4 h. p.r.n. pain.   She will need an appointment with Dr. Thurston Hole for followup on November 10, 2006.  Please call [phone] to schedule this. She will also need an  appointment if she has increased redness about her wound, if she has  increased swelling, if she has increased drainage or a temp greater than  101. She is being discharged in stable condition to [facility] skilled  nursing facility.      Kirstin Shepperson, P.A.      Robert A. Thurston Hole, M.D.  Electronically Signed    KS/MEDQ  D:  10/30/2006  T:  10/30/2006  Job:  [id number]

## 2011-02-01 NOTE — Discharge Summary (Signed)
[name], [name]                           ACCOUNT NO.:  [id number]   MEDICAL RECORD NO.:  [id number]                   PATIENT TYPE:  INP   LOCATION:  2014                                 FACILITY:  [facility]   PHYSICIAN:  [name]. [name], M.D.              DATE OF BIRTH:  10-Aug-1923   DATE OF ADMISSION:  02/02/2003  DATE OF DISCHARGE:  02/16/2003                                 DISCHARGE SUMMARY   PRIMARY ADMITTING DIAGNOSIS:  Palpitations.   ADDITIONAL AND DISCHARGE DIAGNOSES:  1. Coronary artery disease.  2. Hypertension.  3. Hyperlipidemia.  4. Postoperative atrial fibrillation.  5. History of mastectomy secondary to breast cancer.  6. Postoperative confusion.  7. Mild hyponatremia.  8. History of questionable cerebrovascular accident in the past.   PROCEDURES:  1. Cardiac catheterization.  2. Coronary artery bypass grafting x4 (left internal mammary artery to the     left anterior descending, saphenous vein graft to the ramus intermedius,     saphenous vein graft sequentially to the posterior descending and     posterolateral).  3. Endoscopic vein harvest, right thigh.   HISTORY:  The patient is a 77 year old white female who presented on the  date of this admission complaining of palpitations and severe dyspnea  accompanied by diaphoresis.  She was awakened from sleep with these symptoms  and took four baby aspirin without relief.  She called EMS and was brought  to the emergency department.  She was tachycardic on admission with heart  rates in the 120-130 range.  She was given IV Lopressor and her symptoms  resolved completely.  She was noted to have some EKG changes.  She was  admitted and a cardiology consultation was obtained.   HOSPITAL COURSE:  She was seen by cardiology and upon further discussion  with the patient she has had some dyspnea on exertion for the past several  months prior to this admission.  It was felt that in light of her acute  onset of  symptoms and her history of some dyspnea on exertion that she  should undergo cardiac catheterization.  This was performed on May 19 and  showed severe three-vessel coronary artery disease with well-preserved left  ventricular function.  She was not felt to be a good candidate for  percutaneous intervention.  A cardiothoracic surgery consultation was  obtained.  The patient was seen by Dr. Cornelius Wise and her films were reviewed and  it was agreed that surgery was her best option.  After the explanation of  the risks, benefits, and alternatives of the procedure the patient consented  to proceed.  She was medically stabilized and was able to be taken to the  operating room on Feb 08, 2003 where she underwent CABG x4, as described in  detail above.  She tolerated the procedure well and was transferred to the  SICU in stable condition.  She was able  to be extubated shortly after  surgery and was hemodynamically stable and doing well on postoperative day  #1.  She was started on diuresis, as well as restarted on her Plavix.  She  was able to be transferred to the floor late in the day postoperative day  #1.  In the immediate postoperative period, she was somewhat confused and  agitated.  She had no other neurologic symptoms and her neurologic exam was  completely within normal limits.  All narcotic medications were held.  She  was also noted to be hyponatremic with a sodium of 126.  This was felt to be  due to excess free water, as the patient was quite volume overloaded, up to  about 20 pounds above her preoperative weight.  She was aggressively  diuresed and her sodium improved.  Also, with the discontinuation of her  narcotics, her mental status returned to baseline.  At the end of the day of  postoperative day #2, she developed an episode of rapid atrial fibrillation.  She was started on a Cardizem drip and converted to normal sinus rhythm.  She was mildly hypertensive also and her beta-blocker  dose was increased.  Late in the day on postoperative day #3, she was able to be switched from IV  to p.o. Cardizem.  She continued to have some problems with volume status.  Despite diuresis with Lasix, her weight has remained about 20 pounds above  her preoperative weight.  Therefore, she was additionally given Zaroxolyn.  Her diuresis has improved since the addition of the Zaroxolyn and at the  present she is only about 10 pounds above her preoperative weight.  Her  sodium has also improved and has stabilized at 129.  Her potassium has been  adequately repleted and currently is 4.  She had one additional episode of  atrial fibrillation, after which she was started on amiodarone, in addition  to the Coreg and the Cardizem.  Since that time, she has remained in normal  sinus rhythm.  Otherwise, she has done well postoperatively.  She is  ambulating in the halls with cardiac rehab Phase I and also independently  with her family and is doing fairly well.  She is tolerating a regular diet  and is having normal bowel and bladder function.  Her surgical incision  sites are healing well.  Her other labs have remained stable and chest x-  rays have been within normal limits.  It is felt that, if she continues to  remain in sinus rhythm and is stable otherwise, she will be ready for  discharge home on February 16, 2003.   DISCHARGE MEDICATIONS:  1. Enteric-coated aspirin 325 mg daily.  2. Plavix 75 mg daily.  3. Lipitor 40 mg q.h.s.  4. Cardizem CD 180 mg daily.  5. Coreg 12.5 mg b.i.d.  6. Amiodarone 400 mg b.i.d.  7. Lasix 40 mg daily x1 week.  8. K-Dur 20 mEq daily x1 week.  9. Tylenol Extra Strength 1-2 q.4-6h. p.r.n. for pain.   DISCHARGE INSTRUCTIONS:  She is to refrain from driving, heavy lifting, or  strenuous activity.  She may continue daily ambulation and use of her  incentive spirometer.  She is asked to shower daily and clean her incisions with soap and water.    DISCHARGE  FOLLOW UP:  She will see Dr. Samule Wise in the office in two weeks and  have a chest x-ray at that visit.  She will then followup with Dr. Cornelius Wise on  Monday, June 28, at 12:30 p.m.  She is asked to bring her chest x-ray to  this appointment for his review.  She is asked to call our office if she  experiences any problems otherwise or have questions in the interim.     Coral Ceo, P.A.                        [name]. [name], M.D.    GC/MEDQ  D:  02/15/2003  T:  02/15/2003  Job:  [id number]   cc:   Reuben Likes, M.D.  [address]  [place]  [place] [address]  Fax: [phone]   Salvadore Farber, M.D.

## 2011-02-01 NOTE — Op Note (Signed)
[name], [name]               ACCOUNT NO.:  [id number]   MEDICAL RECORD NO.:  [id number]          PATIENT TYPE:  INP   LOCATION:  5025                         FACILITY:  [facility]   PHYSICIAN:  Elana Alm. Thurston Hole, M.D. DATE OF BIRTH:  01/20/1923   DATE OF PROCEDURE:  10/27/2006  DATE OF DISCHARGE:                               OPERATIVE REPORT   PREOPERATIVE DIAGNOSIS:  Right hip degenerative joint disease.   POSTOPERATIVE DIAGNOSIS:  Right hip degenerative joint disease.   PROCEDURE:  Right total hip replacement using DePuy Press-Fit total hip  system with 52 mm Press-Fit acetabulum with 10 degree polyethylene and  two locking screw.  Femoral component #4 Press-Fit Summit femoral stem  with +1.5 x 32 mm femoral head.   SURGEON:  Elana Alm. Thurston Hole, M.D.   ASSISTANT:  Julien Girt, P.A.   ANESTHESIA:  General.   OPERATIVE TIME:  1 hour 30 minutes.   ESTIMATED BLOOD LOSS:  350 mL.   COMPLICATIONS:  None.   DESCRIPTION:  [name] was brought to operating room on 10/27/2006  placed on operative table in supine position.  After adequate level  general anesthesia obtained, her right hip was examined.  She had  flexion to 90, extension to 0, internal and external  rotation of 20  degrees with 2 inches of shortening of the right leg compared to left.  She had a Foley catheter placed under sterile conditions, received Ancef  1 g IV preoperatively for prophylaxis.  She was then placed in the right  lateral up decubitus position and secured on the bed with a Mark frame.  Her right hip and leg was then prepped using sterile DuraPrep and draped  using sterile technique.  Originally through a 6-7 cm posterior lateral  incision initial exposure was made over the greater trochanter.  Underlying subcutaneous tissues were incised revealing the underlying  iliotibial band and short external rotators of the hip and fascia over  the gluteus maximus which was incised longitudinally.   Sciatic nerve  carefully protected while the short external rotators of the hip and hip  capsule were released off their femoral neck insertion and tagged and  then the hip was posteriorly dislocated.  A femoral neck cut was then  made 2 cm above the lesser trochanter in the appropriate manner  anteversion, abduction and inclination.  Femoral head was removed.  She  was found to have significant degenerative changes noted in the femoral  head.  The acetabulum was exposed.  Degenerative acetabular labrum was  removed.  Grade 3 and 4 DJD was noted in the acetabulum.  Sequential  acetabular reaming was carried out up to a 51 mm size in the appropriate  amount of anteversion, abduction and inclination and then a 52 mm  acetabular trial was placed with an excellent fit.  It was then removed  and the actual 52 mm acetabular cup was hammered into position in the  appropriate manner anteversion and abduction and inclination with an  excellent fit.  Two locking screws were placed, one in the 12 o'clock  and one in the 11  o'clock position, one 15 mm and one 20 mm, each gave  satisfactory additional fixation.  A 10 degree polyethylene liner was  then placed in the acetabular shell with a 10 degrees lip in the  posterolateral position and hammered into position.  Anterior  osteophytes were then removed as well off of the acetabulum.  After this  was done then the proximal femur was exposed.  Axial reaming was carried  out up to a #5 size, then broaches to a #4 size and with a #4 broach in  place and a 1.5 x 32 mm head trial head and neck, hip was reduced, taken  through range of motion which was stable.  Leg lengths were equalized  and excellent stability up to 60 degrees of internal rotation in both  neutral and 30 degrees of adduction and also stable in abduction and  external rotation.  At this point intraoperative x-ray was obtained that  showed satisfactory position of the acetabulum and of the  femoral trial.  At this point the femoral trial was removed.  The femoral canal was  irrigated and the actual #4 Summit stem was hammered into position with  an excellent press fit and the 1.5 x 32 mm femoral head was hammered  onto the femoral neck with an excellent Morse taper fit.  The hip was  then reduced, taken through full range of motion, found to be stable and  flexion, internal-external rotation and abduction external rotation and  leg lengths were within 1 inch of each other.  At this point, it was  felt that all components were excellent size, and stability.  The wound  was further irrigated and then the short external rotators of the hip  and hip capsule were reattached to their femoral neck insertion through  two drill holes in the greater trochanter.  The iliotibial band and  gluteus maximus fascia was closed with #1 Ethilon suture.  Subcutaneous  tissues closed with 0 and 2-0 Vicryl.  Subcuticular layer closed with 4-  0 Monocryl.  Sterile dressings were applied and the patient turned  supine, checked for leg lengths that were equal, rotation equal, pulses  2+ and symmetric.  Knee immobilizer placed on the right leg.  The  patient then awakened, extubated, taken to recovery in stable condition.  Needle, sponge counts correct x2 in the case.      Robert A. Thurston Hole, M.D.  Electronically Signed     RAW/MEDQ  D:  10/27/2006  T:  10/28/2006  Job:  [id number]

## 2011-02-01 NOTE — Consult Note (Signed)
[name], [name]                           ACCOUNT NO.:  [id number]   MEDICAL RECORD NO.:  [id number]                   PATIENT TYPE:  INP   LOCATION:  2926                                 FACILITY:  [facility]   PHYSICIAN:  Veneda Melter, M.D.                   DATE OF BIRTH:  February 16, 1923   DATE OF CONSULTATION:  02/02/2003  DATE OF DISCHARGE:                                   CONSULTATION   REPORT TITLE:  CARDIOLOGY CONSULTATION.   REFERRING PHYSICIAN:  Ollen Gross. [name], M.D.   CHIEF COMPLAINT:  Palpitations and shortness of breath.   HISTORY:  The patient is a 77 year old female without prior cardiac history  who was admitted to [facility] after two episodes of palpitations and  shortness of breath that occurred last night.  The patient has been in her  usual state of health and early this morning awoke from sleep with  palpitations and felt short of breath.  She took an aspirin.  Eventually,  went back to sleep.  She awoke several hours later with recurrence of  similar symptoms.  She did not have any chest pain.  No nausea, vomiting,  diaphoresis, but was somewhat concerned about her symptoms and called 911.  She does note some tingling of her left foot.  She is noted to be  hypertensive and tachycardic in the emergency room.   She was admitted to the CCU and currently she feels much better.  She has  noted rare episodes of left axillary and chest discomfort.  She attributed  this to mastectomy she has had in the past.  She denies any exertional chest  pain.  She has been exercising since January on a treadmill at the [facility] and  she notes dyspnea with any exertion.  She denies any significant lower  extremity edema, orthopnea or paroxysmal nocturnal dyspnea.  She has not had  any fevers, chills, or other constitutional symptoms.  No syncope or  presyncope.   REVIEW OF SYSTEMS:  Otherwise, is noncontributory.   PAST MEDICAL HISTORY:  1. Transient ischemic attack in January  2004, at which time MRA showed     stenosis of the right MCA and PCA.  She has been treated with Plavix     since then.  2. She is borderline hypertensive, has been untreated.  3. She has hypercholesterolemia.   ALLERGIES:  SULFA and BENZODIAZEPINES.   CURRENT MEDICATIONS:  1. Lipitor 10 mg daily.  2. Plavix 75 mg daily.  3. Aspirin 81 mg daily.  4. Vitamin E.  5. Actonel q. week.  6. Ginkgo biloba.  7. Multivitamin.  8. Vitamin D plus calcium supplements.    PAST SURGICAL HISTORY:  1. She has had bilateral Streptococcal empyema in 1985.  She was     hospitalized x7 weeks and required bilateral chest tubes.  2. She is status post left mastectomy for  breast cancer.  No radiation or     Tamoxifen.  This occurred six years ago.   FAMILY HISTORY:  Father died age 68 of myocardial infarction.  Mother died  age of 15 with myocardial infarction.  She has two sisters, one with breast  cancer, the other with uterine cancer.  She has two children age 44 and 8,  both daughters.  The elder daughter has rheumatoid arthritis and was  recently diagnosed with leukemia.  Younger daughter has hypertension.   SOCIAL HISTORY:  The patient has a remote history of tobacco use.  Quit 3-5  years ago.  Social alcohol use.  Denies recreational drug use.  She has  recent[facility] and notes  some dyspnea on exertion with heart rates of 120 per minute.   REVIEW OF SYSTEMS:  The patient does admit to family stress with recent  diagnosis of leukemia in her elder daughter.  She is also noted to have a  sensitivity to temperature since the time of her empyema.  She has had an  intentional 6 pound weight loss in the past three months.   PHYSICAL EXAMINATION:  GENERAL:  Well-developed, well-nourished white female  in no acute distress.  VITAL SIGNS:  Blood pressure 178/102, heart rate 137, respirations 24, O2  saturation 98% on room air, temperature 98.8.   HEENT:  Pupils are equal, round, and reactive to light.  Extraocular  movements are intact.  Oropharynx shows no lesion.  NECK:  Supple.  No adenopathy.  No carotid bruits.  HEART:  Regular rate.  Tachycardic.  Positive S4.  No murmurs.  LUNGS:  Clear to auscultation.  ABDOMEN:  Soft, nontender.  EXTREMITIES:  There is 1+ pedal edema.  Peripheral pulses are 2+ equal  bilaterally.  Motor strength is 5/5.  Sensory is intact to touch.   LABORATORIES:  White count 7.7, hemoglobin 15.1, hematocrit 45.4, platelets  236,000.  Sodium 141, potassium 3.5, chloride 103, bicarbonate 27, BUN 17,  creatinine 0.8, glucose 133.  Initial CK 73, MB fraction 3.2, Troponin-I  0.03.  LFTs are within normal limits.  INR 0.9, PTT 29.   ECG shows sinus tachycardia at 137.  There is poor R-wave progression,  possible Q-waves in leads V1 and V2.  Nondiagnostic ST depression is noted  laterally.  No acute ST elevations or T-wave changes to suggest acute  ischemia.   ASSESSMENT/PLAN:  The patient is a 77 year old female who presents with  acute onset of palpitations and shortness of breath.  She currently does not  have any dysrhythmias on telemetry to suggest an etiology of her  palpitations.  She does report having checked blood pressure readings at  home and these have fluctuated some.  However, she has been untreated for  hypertension.  Potential causes of her discomfort include pulmonary embolus;  however, we doubt this as she is not currently short of breath and does not  have significant chest discomfort, pneumonia; however, she has denied any  constitutional symptoms; poorly controlled hypertension possibly with  resultant left ventricular hypertrophy and diastolic dysfunction; coronary  artery disease with underlying ischemia as a trigger; also possibility of  thyroid dysfunction; pheochromocytoma,  doubt this as she has not had any flushing; electrolyte abnormalities; valvular dysfunction such as  mitral  stenosis or regurgitation as well as aortic valve disease may cause  palpitations and tachycardia; finally, sleep apnea, although patient does  not have body habitus to suggest this.  At this point, we will rule out myocardial infarction with serial cardiac  enzymes and will repeat her electrocardiogram to determine if any changes  are rate related.  Will advance control of blood pressure and heart rate  with beta-blocker as well as ARB and diuretics as needed.  Will also obtain  echocardiogram to assess left ventricular wall thickness and valvular  function.  Ischemic workup may include stress testing versus cardiac  catheterization.  However, given her family history of coronary disease,  remote history of tobacco use, hypertension, dyslipidemia, patient certainly  has multiple risk factors that increase her pretest probability.  We will  thus proceed with a cardiac catheterization to define her anatomy.  The  risks, benefits, and alternatives of which including possible percutaneous  intervention were discussed and the patient understands and wishes to  proceed.  Further workup pending our initial findings.                                               Veneda Melter, M.D.    NG/MEDQ  D:  02/02/2003  T:  02/02/2003  Job:  [id number]   cc:   Ollen Gross. Vernell Morgans, M.D.  [address]  [place]  [place] [address]  Fax: [phone]

## 2011-02-01 NOTE — Consult Note (Signed)
[name], [name]                           ACCOUNT NO.:  [id number]   MEDICAL RECORD NO.:  [id number]                   PATIENT TYPE:  INP   LOCATION:  3715                                 FACILITY:  [facility]   PHYSICIAN:  Salvatore Decent. Cornelius Moras, M.D.              DATE OF BIRTH:  1922/10/26   DATE OF CONSULTATION:  02/03/2003  DATE OF DISCHARGE:                                   CONSULTATION   REQUESTING PHYSICIAN:  Dr. Randa Evens.   PRIMARY CARE PHYSICIAN:  Dr. Leslee Home.   REASON FOR CONSULTATION:  Severe three-vessel coronary artery disease with  class V unstable angina.   HISTORY OF PRESENT ILLNESS:  The patient is a 77 year old patient from  [place] who is followed carefully by Dr. Leslee Home with no previous  history of coronary artery disease.  The patient has risk factors including  history of hyperlipidemia and borderline hypertension.  She has remote  history of a trivial amount of tobacco use.  The patient reports that she  has been in her usual state of health although over the last several months  or year she has noted some tendency towards increasing fatigue and very mild  exertional shortness of breath.  She has otherwise been doing well until  early yesterday morning when she awoke from her sleep with palpitations and  vague aching pain radiating down both arms.  She denied any pain in her  chest or shortness of breath but noted that her heart was pounding and her  heartbeat was rapid.  She took aspirin and went back to sleep.  Several  hours later, she was again awoken by the same sensation which persisted,  prompting her to call EMS.  She was brought to the emergency room where  electrocardiogram revealed some slight ST segment changes in the inferior  and lateral leads and Q waves in leads V1 and V3.  She was admitted to the  hospital and had borderline elevated cardiac enzymes with a peak troponin-I  of 0.1 and peak CK and CK-MB of 116 and 4.3,  respectively.  She underwent  cardiac catheterization by Dr. Samule Ohm yesterday.  This reveals severe three-  vessel coronary artery disease with normal left ventricular function.  She  also underwent 2-D echocardiogram which confirmed the presence of normal  left ventricular function and no significant valvular heart disease.  Cardiac surgical consultation has been requested.   REVIEW OF SYSTEMS:  GENERAL:  The patient reports otherwise feeling well.  She has a good appetite.  She has lost about 10 pounds in weight on a diet  that she started in January to address her hyperlipidemia.  CARDIAC:  Notable for the absence of any classical symptoms of chest pain or chest  tightness either with activity or at rest.  The patient has only very mild  symptoms of exertional shortness of breath and this does not limit her  much  at all.  She denies any history of resting shortness of breath, PND,  orthopnea, or lower extremity edema.  RESPIRATORY:  Notable for the absence  of any recent productive cough, hemoptysis, wheezing.  GASTROINTESTINAL:  Negative.  The patient denies difficulty swallowing.  She denies history of  hematochezia, hematemesis, or melena.  Her bowel function is regular with no  problems with constipation or diarrhea.  MUSCULOSKELETAL:  Notable for mild  arthritis in both hands which does not bother her too much.  NEUROLOGIC:  Notable for a transient episode of numbness involving her left forearm and  hand which occurred in January.  This spell lasted 15 minutes and resolved.  She has not had any symptoms similar since then.  She denies any episodes of  transient monocular blindness.  She denies problems with headaches or recent  visual disturbances or a history of seizure.  ENDOCRINE:  Negative.  The  patient denies symptoms suggestive of diabetes.  HEMATOLOGIC:  Negative  although the patient has noted a slight tendency for bleeding diathesis and  she has been taking Plavix.   HEENT:  Negative.  The patient wears glasses  and her eyesight is stable.  She sees her dentist regularly and reports no  dental problems.  INFECTIOUS:  Negative.  The patient denies recent fevers  or chills.  GENITOURINARY:  Negative.  The patient does report occasional  urinary frequency but denies any urgency, dysuria, or hematuria.    PAST MEDICAL HISTORY:  1. History of borderline hypertension.  2. Hyperlipidemia.  She recently started Lipitor.  3. She has history of allergic rhinitis.  4. Bilateral hearing loss.  5. She has history of single-episode transient ischemic attack in January     2004 and she underwent MRI of the brain at that time that was notable for     the presence of small vessel disease without acute abnormality.  6. The patient denies any known history of coronary artery disease,     congestive heart failure, diabetes, previous stroke.  7. The patient does have history of breast cancer and underwent left     modified radical mastectomy in 1998 by Dr. Francina Ames.  All of her lymph     nodes were reportedly negative and she did not require any postoperative     adjuvant chemotherapy or radiation therapy.  8. The patient also developed bilateral streptococcal pneumonia and     parapneumonic empyema in 1985.  Her empyemas were treated with chest tube     placement by Dr. Edwyna Shell.  She did not require formal thoracotomy although     her chest tubes remained in for many weeks and this required a prolonged     hospitalization.   SOCIAL HISTORY:  The patient lives with her husband and has sisters who are  supportive and live nearby.  They have two children and three grandchildren.  The patient is a nonsmoker and has a remote history of a trivial amount of  tobacco use.  The patient denies excessive alcohol consumption.   MEDICATIONS PRIOR TO ADMISSION:  1. Plavix.  2. Aspirin.  3. Lipitor.  4. Actonel.  5. Vitamin D and calcium supplement. 6. Ginkgo biloba.  7.  Multivitamin.   ALLERGIES:  The patient reports a drug allergy to SULFA-CONTAINING PRODUCTS.   PHYSICAL EXAMINATION:  GENERAL:  Notable for a well-appearing white female  who appears her stated age in no acute distress.  VITAL SIGNS:  She is afebrile and presently  normotensive.  Her blood  pressure was elevated at the time of admission.  It measured 186/109.  She  is 5 feet 4 inches tall and weighs 144 pounds.  HEENT:  Exam is essentially unrevealing.  She wears glasses.  NECK:  Supple.  There is no cervical or supraclavicular lymphadenopathy.  There is no jugular venous distention.  No carotid bruits are noted.  CHEST:  Auscultation of the chest demonstrates clear and symmetrical breath  sounds bilaterally.  No wheezes or rhonchi are noted.  There is a well-  healed surgical scar from previous left modified radical mastectomy.  There  is no palpable soft tissue mass or axillary adenopathy.  CARDIOVASCULAR:  Regular rate and rhythm.  No murmurs, rubs, or gallops are  noted.  ABDOMEN:  Soft and nontender.  There are no palpable masses.  The liver edge  is not enlarged.  Bowel sounds are present.  EXTREMITIES:  Warm and well perfused.  There is no lower extremity edema.  Distal pulses are easily palpable in both lower legs at the ankles.  SKIN:  Her skin is dry and scaly throughout but there are no open lesions or  rashes.  NEUROLOGIC:  Grossly nonfocal.  RECTAL/GENITOURINARY:  Exams are both deferred.   DIAGNOSTIC TESTS:  Cardiac catheterization performed by Dr. Samule Ohm has been  reviewed.  This demonstrates severe three-vessel coronary artery disease  with preserved left ventricular function.  There is 40%-50% proximal  stenosis of the left main coronary artery.  There is 70%-80% diffuse long  segment proximal stenosis of the left anterior descending coronary artery  with 90%-95% stenosis of the midportion of this vessel and another 80%-90%  stenosis in the distal left anterior  descending coronary artery.  There is a  large ramus intermediate branch with 90% proximal stenosis.  There is left  dominant coronary circulation.  The distal left circumflex coronary artery  gives rise to a very small posterior descending artery coronary artery and a  medium-sized posterolateral branch.  There is 90% stenosis of the distal  left circumflex coronary artery before this bifurcation and 95% proximal  stenosis of the posterolateral branch.  There is a small nondominant right  coronary artery which has 99% proximal stenosis.  Left ventricular function  is preserved with no significant wall motion abnormalities.   IMPRESSION:  Severe three-vessel coronary artery disease with class V  unstable angina.  The patient also has hypertension and hyperlipidemia.  She  has a remote history of bilateral empyema which could have resulted in  formation of scar tissue or adhesions surrounding the left lung that may be encountered during surgery.  I believe that the patient would best be  treated by elective coronary artery bypass grafting.   PLAN:  The patient has been taking Plavix for some time and this has been  discontinued following her heart catheterization yesterday.  We tentatively  plan to proceed with surgery first case Tuesday May 25.  This will have  allowed the patient to be off Plavix for five full days.  I have outlined  options at length with the patient and her husband.  All of their questions  have been addressed.  They understand and accept all associated risks of  surgery including but not limited to risk of death, stroke, myocardial  infarction, bleeding requiring blood transfusion, arrhythmia, infection, and  recurrent coronary artery disease.  They also understand the possibility  that we might encounter scar tissue surrounding the lung in the vicinity of  the left internal mammary artery that could potentially pose an issue and  potentially result in leaking from  the lung parenchyma following surgery.  All of their questions have been addressed.                                               Salvatore Decent. Cornelius Moras, M.D.    CHO/MEDQ  D:  02/03/2003  T:  02/04/2003  Job:  [id number]   cc:   Salvadore Farber, M.D.   Reuben Likes, M.D.  [address]  [place]  [place] [address]  Fax: [phone]   Veneda Melter, M.D.

## 2011-02-01 NOTE — Cardiovascular Report (Signed)
[name], [name]                           ACCOUNT NO.:  [id number]   MEDICAL RECORD NO.:  [id number]                   PATIENT TYPE:  INP   LOCATION:  2926                                 FACILITY:  [facility]   PHYSICIAN:  Salvadore Farber, M.D.             DATE OF BIRTH:  [birth year]   DATE OF PROCEDURE:  02/02/2003  DATE OF DISCHARGE:                              CARDIAC CATHETERIZATION   PROCEDURE:  Left heart catheterization, left ventriculography, abdominal  aortography without runoff, coronary angiography.   INDICATIONS:  The patient is a 77 year old lady with no prior history of  cardiac disease.  She does have risk factors of dyslipidemia and prior TIA  attributed to intracranial stenosis.  She was awakened last night with  palpitations and severe shortness of breath accompanied by diaphoresis.  She  presented to the emergency room where she was found to be in sinus  tachycardia with modest ST depressions in V4 and V5.  She had no chest  discomfort.  Initial cardiac enzymes were normal.  Because of the severe  dyspnea it was felt by Veneda Melter, M.D. to represent pulmonary edema and  she was therefore scheduled for urgent diagnostic angiography.   DIAGNOSTIC TECHNIQUE:  Informed consent was obtained.  Under 1% lidocaine  local anesthesia a 6 French sheath was placed in the right femoral artery  using the modified Seldinger technique.  Coronary angiography was performed  using JL4 and JR4 catheters.  Ventriculography was performed using a pigtail  catheter.  The pigtail was pulled back into the suprarenal abdominal aorta.  Abdominal aortography was performed by power injection.  The patient  tolerated the procedure well and was transferred to the holding room in  stable condition.  The sheath is to be removed there.   COMPLICATIONS:  None.   FINDINGS:  1. LV 142/5/10.  EF 70% without regional wall motion abnormality.  2. No aortic stenosis or mitral regurgitation.  3.  Left main:  There is an ostial 50% stenosis.  4. LAD:  The LAD is a large vessel which wraps around the apex of the heart.     It gives rise to no substantive diagonal branches.  There is a long     proximal, heavily calcified stenosis up to 70%.  There is a focal 95%     stenosis of the mid vessel at the site of the takeoff of the third septal     perforator.  In the more distal LAD there is a focal 60% stenosis.  5. Ramus intermedius:  This is a large branching vessel.  There is a     proximal 90% hazy stenosis which appears to be culprit lesion.  This     vessel, too, is heavily calcified.  The more medial branch has a 95%     stenosis in its proximal third.  The more lateral branch has an 80%  stenosis in its proximal third.  6. Circumflex:  This is a codominant vessel giving rise to the PDA and two     small obtuse marginals that arise quite distally.  There is an 85%     stenosis of the distal circumflex before the origin of the first obtuse     marginal.  There is a 90% stenosis of the left PDA.  7. RCA:  This is a small, but codominant vessel.  There is a 99% stenosis of     the proximal vessel.  8. Single renal arteries bilaterally.  The left is clearly normal.  The     right ostium is not well visualized, but appears to be normal.   IMPRESSION/RECOMMENDATIONS:  1. Severe multivessel coronary disease involving the left anterior     descending, ramus, circumflex, and right coronary artery.  Recommend     coronary artery bypass grafting.  2. Preserved left ventricular systolic function without regional wall motion     abnormality.  3. No aortic stenosis or mitral regurgitation.   I recommend coronary artery bypass grafting.  Will therefore contact [facility].  Will discontinue the Plavix, but continue aspirin and low molecular weight  heparin.                                                Salvadore Farber, M.D.    WED/MEDQ  D:  02/02/2003  T:  02/03/2003  Job:  [id number]    cc:   Reuben Likes, M.D.  [address]  [place]  [place] [address]  Fax: [phone]   Veneda Melter, M.D.

## 2011-02-01 NOTE — H&P (Signed)
[name], [name]                           ACCOUNT NO.:  [id number]   MEDICAL RECORD NO.:  [id number]                   PATIENT TYPE:  INP   LOCATION:  2926                                 FACILITY:  [facility]   PHYSICIAN:  Reuben Likes, M.D.               DATE OF BIRTH:  [birth year]   DATE OF ADMISSION:  02/02/2003  DATE OF DISCHARGE:                                HISTORY & PHYSICAL   CHIEF COMPLAINT:  Rapid heart beat and both arms ache.   HISTORY OF PRESENT ILLNESS:  The patient is a 77 year old female who was  awakened at 2 a.m. today by rapid heart beat and aching down both arms.  She  did not have any chest or upper back pain or shortness of breath.  She did  break out in a sweat and her husband said she looked ashen.  She took four  baby aspirins but felt no better and called EMS and was transported to [facility] Emergency Room.  Upon arrival, her monitor showed sinus tachycardia  with rates in the 120 to 130 range.  She was given IV Lopressor and her  symptoms resolved completely.  An EKG showed Q waves in leads V1 and V2 and  ST segment depression in V3 and V4 and II, III and aVF, all new changes.  There was no prior history of cardiac disease.  Her risk factors include  only hyperlipidemia.   PAST MEDICAL HISTORY:   SURGERIES:  The patient had a mastectomy in 1998 due to breast cancer, done  by Dr. Rose Phi. Young.   OTHER HOSPITALIZATIONS:  She was hospitalized in 1985 for bilateral  empyemas.  These were drained surgically by Dr. Ines Bloomer.   OTHER ILLNESSES:  Allergic rhinitis, neurosensory hearing loss,  hyperlipidemia and she had two TIAs in January 2004 with left hand numbness  and weakness and MRA showed intracranial stenoses of the right middle  cerebral artery.   MEDICATIONS:  1. Plavix 75 mg once a day.  2. Aspirin 81 mg a day.  3. Lipitor 10 mg once a day.   ALLERGIES:  The patient is allergic to SULFA which causes her to break down  in a  rash.   FAMILY HISTORY:  The patient's mother died at age 70 of an MI.  Father died  at age 74 of an MI.  She has two sisters, one has arthritis and breast  cancer, the other one has uterine cancer.  She has two daughters, one of  whom was recently diagnosed with CLL.  The other is in good health.   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  She is married and lives in a house in [place] with her  husband.  She has never used alcohol or tobacco.   REVIEW OF SYSTEMS:  Denies other systemic, skin,  eye, ENT, respiratory,  cardiovascular, GI, GU, musculoskeletal, neurological or psychiatric  complaints.   PHYSICAL EXAMINATION:  VITAL SIGNS:  Blood pressure 178/102, pulse 137 and  regular, respirations 24, temperature 98.8.  GENERAL:  Alert and in no distress.  SKIN:  Skin warm and dry.  EYES:  Pupils equal, round and reactive to light; full EOMs; fundi benign;  sclerae nonicteric.  ENT:  TMs are normal; she is wearing hearing aids.  No intraoral lesions,  mucous membranes moist, pharynx clear.  NECK:  Neck supple with no adenopathy, no JVD, no bruit.  Thyroid was  normal.  LUNGS:  Lungs clear to auscultation and percussion without wheezes, rales or  rhonchi.  HEART:  Regular rhythm, no gallop or murmur or rub.  CHEST:  No chest wall tenderness.  ABDOMEN:  Abdomen is soft and nontender without organomegaly or mass.  No  pulsatile midline abdominal mass or bruit.  Bowel sounds normally active.  EXTREMITIES:  No pedal edema.  Pedal pulses were full.  NEUROLOGICAL:  Alert and oriented x3.  Speech was clear and appropriate.  No  extremity weakness or tremor.  DTRs 2+ and symmetrical.  Babinski's  downgoing.  Cranial nerves intact.   LABORATORY DATA:  A CBC shows hemoglobin of 15.1, white count of 7700.  Sodium 141, potassium 3.5, glucose 133, BUN 17, creatinine 0.8.  CPK-MB was  73 with MB of 3.2.  Troponin was 0.03.   ADMITTING  IMPRESSION:  1. Tachycardia, arm pain and abnormal electrocardiogram, myocardial     infarction versus angina.  2. Hyperlipidemia.  3. History of transient ischemic attacks.  4. History of breast cancer.  5. History of empyemas.  6. Neurosensory hearing loss.  7. Allergic rhinitis.   PLAN:  1. Cardiology consult.  2. Serial enzymes and EKGs.  3. Heparin drip.  4. Lopressor.                                               Reuben Likes, M.D.    DCK/MEDQ  D:  02/02/2003  T:  02/03/2003  Job:  [id number]

## 2011-02-01 NOTE — H&P (Signed)
[name], [name]                           ACCOUNT NO.:  [id number]   MEDICAL RECORD NO.:  [id number]                   PATIENT TYPE:  OBV   LOCATION:  1827                                 FACILITY:  [facility]   PHYSICIAN:  Cassell Clement, M.D.              DATE OF BIRTH:  [birth year]   DATE OF ADMISSION:  06/12/2003  DATE OF DISCHARGE:                                HISTORY & PHYSICAL   CHIEF COMPLAINT:  Chest pain.   HISTORY:  This is a 77 year old married Caucasian female, who awoke early  this morning with a feeling of brief chest tightness and a forceful heart  beat.  She has a history of known coronary artery disease.  She underwent  four-vessel coronary artery bypass graft surgery by Salvatore Decent. Cornelius Moras, M.D.  on Feb 08, 2003.  Her presentation at that time was palpitations rather than  chest pain, and she was found to have severe three-vessel coronary disease  in May 2004, necessitating bypass surgery.  Postoperatively, she has done  well.  She has just finished 12 weeks of cardiac rehab.  She has not been  experiencing any postoperative exertional chest pain to suggest angina  pectoris.  This morning, she noted the chest tightness off and on several  times.  She became quite nervous and apprehensive, according to her husband.  She checked her blood pressure, and it was elevated at 176/90.  She took an  aspirin and called 911 and was brought to the hospital.  Work-up so far in  the emergency room shows no acute changes, and initial point of care enzymes  are negative x 1.   HOME MEDICATIONS:  1. Actonel once a week.  2. Plavix 75 mg daily.  3. Cardia 180 mg daily.  4. Altace 5 mg daily.  5. Celexa 5 mg daily.  6. Ambien 5 mg h.s.  7. Baby aspirin daily.  8. Lipitor 40 mg daily.   ALLERGIES:  SULFA.   PREVIOUS OPERATIONS:  1. Left mastectomy in 1998.  2. In 1985, she was hospitalized for seven weeks with a bilateral     Streptococcal empyema.  She required bilateral  chest tubes by Ines Bloomer, M.D. at that time.   FAMILY HISTORY:  Positive for coronary artery disease.  Her father had a  heart attack in his 80's and died at 28 of chronic heart failure.   SOCIAL HISTORY:  She quit smoking 45 years ago at the time of her daughter's  birth.  She does not use any alcohol.  She is married and has two daughters.   REVIEW OF SYSTEMS:  GASTROINTESTINAL:  No history of ulcer disease or known  hiatal hernia.  GENITOURINARY:  History of bladder trouble since bypass  surgery, but it seems to be gradually getting better.  She still has  nocturia once or twice a night.  PULMONARY:  She  admits to a persistent dry  cough.  She is having no chills or fever or productive sputum.  MUSCULOSKELETAL:  She does have a history of arthritis in the hip.  NEUROLOGIC:  She has noted decreased memory since bypass surgery, and her  husband comments that she tends to be quite nervous.   The remainder of the review of systems are negative in detail.   PHYSICAL EXAMINATION:  VITAL SIGNS:  Blood pressure 152/79, pulse 79  regular; respirations are normal.  Oxygen saturation 99%.  HEAD AND NECK:  The pupils are equal and reactive.  The sclerae are clear.  Mouth and pharynx are normal.  Carotids normal.  Jugular venous pressure  normal.  Thyroid normal.  No lymphadenopathy.  CHEST:  Clear to percussion and auscultation.  HEART:  No S3 gallop, no murmur, click, or rub.  BREASTS:  Left breast absent.  Right breast not examined.  ABDOMEN:  Soft and nontender without organomegaly or masses.  EXTREMITIES:  Good peripheral pulses except diminished left dorsalis pedis  pulse.  She denies any peripheral edema, and there is none today, and there  is no evidence of phlebitis.  NEUROLOGIC:  Physiologic.  SKIN:  Normal.   Here electrocardiogram in the emergency room shows normal sinus rhythm.  No  acute ST-T wave changes, but she does have a loss of RNV2 which may be  positional.   Chest x-ray was taken and is not presently found in the  emergency room department.  Labs included a CBC which is normal.  CK-MB is  normal at 2.8.  Troponin I normal at 0.05.   IMPRESSION:  1. Chest pain, rule out myocardial infarction.  2. Ischemic heart disease, status post CABG for three-vessel coronary     disease in May 2004.  3. Palpitations.  4. History of anxiety.  5. History of hypercholesterolemia.   DISPOSITION:  1. She is being admitted.  2. We will obtain serial enzymes to rule out myocardial infarction.  3. Will treat with IV heparin by pharmacy protocol and add low-dose beta     blocker to her regimen.  4. She may use nitrates p.r.n.  5. We will hold breakfast in the morning.  6. Further testing to be determined by Georga Hacking, M.D. on September     27.                                                Cassell Clement, M.D.    TB/MEDQ  D:  06/12/2003  T:  06/12/2003  Job:  [id number]   cc:   Darden Palmer., M.D.  [address]  [place]  [place] [address]  Fax: [phone]   Reuben Likes, M.D.  [address]  [place]  [place] [address]  Fax: [phone]   Salvatore Decent. Cornelius Moras, M.D.  [address]  [place]  [place] [address]

## 2011-06-12 LAB — COMPREHENSIVE METABOLIC PANEL
ALT: 18
AST: 30
Albumin: 3.6
Alkaline Phosphatase: 47
Chloride: 100
Creatinine, Ser: 0.72
GFR calc Af Amer: >60
Potassium: 3.9
Sodium: 136
Total Bilirubin: 1.1

## 2011-06-12 LAB — URINALYSIS, ROUTINE W REFLEX MICROSCOPIC
Glucose, UA: NEGATIVE
Ketones, ur: >80 — AB
Leukocytes, UA: NEGATIVE
Nitrite: NEGATIVE
Protein, ur: 30 — AB
pH: 6

## 2011-06-12 LAB — TROPONIN I: Troponin I: 0.2 — ABNORMAL HIGH

## 2011-06-12 LAB — URINE MICROSCOPIC-ADD ON

## 2011-06-12 LAB — CBC
Platelets: 198
Platelets: 216
RDW: 13.4
WBC: 10.1
WBC: 9.1

## 2011-06-12 LAB — DIFFERENTIAL
Basophils Absolute: 0
Eosinophils Absolute: 0
Lymphocytes Relative: 8 — ABNORMAL LOW
Lymphs Abs: 0.9
Neutrophils Relative %: 83 — ABNORMAL HIGH

## 2011-06-12 LAB — B-NATRIURETIC PEPTIDE (CONVERTED LAB): Pro B Natriuretic peptide (BNP): 413 — ABNORMAL HIGH

## 2011-06-12 LAB — LIPID PANEL
Cholesterol: 193
HDL: 74

## 2011-06-12 LAB — PROTIME-INR: Prothrombin Time: 13.4

## 2011-06-12 LAB — CARDIAC PANEL(CRET KIN+CKTOT+MB+TROPI)
Relative Index: 3.9 — ABNORMAL HIGH
Total CK: 167
Total CK: 184 — ABNORMAL HIGH
Troponin I: 0.1 — ABNORMAL HIGH
Troponin I: 0.17 — ABNORMAL HIGH

## 2011-06-12 LAB — BASIC METABOLIC PANEL
BUN: 21
Creatinine, Ser: 0.77
GFR calc non Af Amer: >60
Glucose, Bld: 114 — ABNORMAL HIGH

## 2011-06-12 LAB — CK TOTAL AND CKMB (NOT AT ARMC)
CK, MB: 6.2 — ABNORMAL HIGH
Total CK: 137

## 2011-07-18 DIAGNOSIS — I2510 Atherosclerotic heart disease of native coronary artery without angina pectoris: Secondary | ICD-10-CM

## 2011-07-18 DIAGNOSIS — M159 Polyosteoarthritis, unspecified: Secondary | ICD-10-CM

## 2011-07-18 DIAGNOSIS — R350 Frequency of micturition: Secondary | ICD-10-CM

## 2011-07-18 DIAGNOSIS — L219 Seborrheic dermatitis, unspecified: Secondary | ICD-10-CM

## 2011-07-18 DIAGNOSIS — F329 Major depressive disorder, single episode, unspecified: Secondary | ICD-10-CM

## 2011-07-18 HISTORY — DX: Atherosclerotic heart disease of native coronary artery without angina pectoris: I25.10

## 2011-07-18 HISTORY — DX: Frequency of micturition: R35.0

## 2011-07-18 HISTORY — DX: Major depressive disorder, single episode, unspecified: F32.9

## 2011-07-18 HISTORY — DX: Seborrheic dermatitis, unspecified: L21.9

## 2011-07-18 HISTORY — DX: Polyosteoarthritis, unspecified: M15.9

## 2011-07-25 ENCOUNTER — Emergency Department (HOSPITAL_COMMUNITY): Payer: Medicare Other

## 2011-07-25 ENCOUNTER — Emergency Department (HOSPITAL_COMMUNITY)
Admission: EM | Admit: 2011-07-25 | Discharge: 2011-07-25 | Disposition: A | Discharge status: Nursing Home (Includes ICF) | Payer: Medicare Other | Attending: Emergency Medicine | Admitting: Emergency Medicine

## 2011-07-25 ENCOUNTER — Encounter: Payer: Self-pay | Admitting: Emergency Medicine

## 2011-07-25 DIAGNOSIS — S0083XA Contusion of other part of head, initial encounter: Secondary | ICD-10-CM | POA: Insufficient documentation

## 2011-07-25 DIAGNOSIS — F0390 Unspecified dementia without behavioral disturbance: Secondary | ICD-10-CM | POA: Insufficient documentation

## 2011-07-25 DIAGNOSIS — I10 Essential (primary) hypertension: Secondary | ICD-10-CM | POA: Insufficient documentation

## 2011-07-25 DIAGNOSIS — S0003XA Contusion of scalp, initial encounter: Secondary | ICD-10-CM | POA: Insufficient documentation

## 2011-07-25 DIAGNOSIS — W010XXA Fall on same level from slipping, tripping and stumbling without subsequent striking against object, initial encounter: Secondary | ICD-10-CM | POA: Insufficient documentation

## 2011-07-25 HISTORY — DX: Unspecified dementia, unspecified severity, without behavioral disturbance, psychotic disturbance, mood disturbance, and anxiety: F03.90

## 2011-07-25 HISTORY — DX: Essential (primary) hypertension: I10

## 2011-07-25 MED ORDER — TETANUS-DIPHTH-ACELL PERTUSSIS 5-2.5-18.5 LF-MCG/0.5 IM SUSP
0.5000 mL | Freq: Once | INTRAMUSCULAR | Status: AC
  Administered 2011-07-25: 0.5 mL via INTRAMUSCULAR
  Filled 2011-07-25: qty 0.5

## 2011-07-25 MED ORDER — TETANUS-DIPHTHERIA TOXOIDS TD 5-2 LFU IM INJ: 0.5000 mL | INJECTION | Freq: Once | INTRAMUSCULAR | Status: DC

## 2011-07-25 NOTE — ED Provider Notes (Signed)
History     CSN: [id number] Arrival date & time: 07/25/2011 11:55 AM   First MD Initiated Contact with Patient 07/25/11 1157      Chief Complaint  Patient presents with  . Fall    (Consider location/radiation/quality/duration/timing/severity/associated sxs/prior treatment) HPI  Past Medical History  Diagnosis Date  . Hypertension   . Dementia     Past Surgical History  Procedure Date  . Mastectomy   . Coronary artery bypass graft     No family history on file.  History  Substance Use Topics  . Smoking status: Never Smoker   . Smokeless tobacco: Not on file  . Alcohol Use: No    OB History    Grav Para Term Preterm Abortions TAB SAB Ect Mult Living                  Review of Systems  Allergies  Review of patient's allergies indicates no known allergies.  Home Medications   Current Outpatient Rx  Name Route Sig Dispense Refill  . PLAVIX PO Oral Take by mouth.        BP 178/98  Pulse 72  Temp(Src) 98.5 F (36.9 C) (Oral)  Resp 18  SpO2 96%  Physical Exam  ED Course  Procedures (including critical care time)  Labs Reviewed - No data to display Ct Head Wo Contrast  07/25/2011  *RADIOLOGY REPORT*  Clinical Data:  Trauma/fall, hit head, hematoma above right ear  CT HEAD WITHOUT CONTRAST CT CERVICAL SPINE WITHOUT CONTRAST  Technique:  Multidetector CT imaging of the head and cervical spine was performed following the standard protocol without intravenous contrast.  Multiplanar CT image reconstructions of the cervical spine were also generated.  Comparison:  None  CT HEAD  Findings: No evidence of parenchymal hemorrhage or extra-axial fluid collection. No mass lesion, mass effect, or midline shift.  No CT evidence of acute infarction.  Subcortical white matter and periventricular small vessel ischemic changes.  Age related atrophy with secondary ventricular prominence.  Intracranial atherosclerosis.  The visualized paranasal sinuses are essentially clear.  The mastoid air cells are unopacified.  No evidence of calvarial fracture.  IMPRESSION: No evidence of acute intracranial abnormality.  Atrophy with small vessel ischemic changes and intracranial atherosclerosis.  CT CERVICAL SPINE  Findings: Normal cervical lordosis.  No evidence of fracture or dislocation.  Vertebral body heights are maintained.  The dens appears intact.  No prevertebral soft tissue swelling.  Moderate multilevel degenerative changes.  Visualized thyroid is unremarkable.  Vascular calcifications.  Visualized lung apices are notable for biapical pleural parenchymal scarring.  IMPRESSION: No evidence of traumatic injury to the cervical spine.  Moderate multilevel degenerative changes.  Original Report Authenticated By: Charline Bills, M.D.   Ct Cervical Spine Wo Contrast  07/25/2011  *RADIOLOGY REPORT*  Clinical Data:  Trauma/fall, hit head, hematoma above right ear  CT HEAD WITHOUT CONTRAST CT CERVICAL SPINE WITHOUT CONTRAST  Technique:  Multidetector CT imaging of the head and cervical spine was performed following the standard protocol without intravenous contrast.  Multiplanar CT image reconstructions of the cervical spine were also generated.  Comparison:  None  CT HEAD  Findings: No evidence of parenchymal hemorrhage or extra-axial fluid collection. No mass lesion, mass effect, or midline shift.  No CT evidence of acute infarction.  Subcortical white matter and periventricular small vessel ischemic changes.  Age related atrophy with secondary ventricular prominence.  Intracranial atherosclerosis.  The visualized paranasal sinuses are essentially clear. The mastoid air cells are  unopacified.  No evidence of calvarial fracture.  IMPRESSION: No evidence of acute intracranial abnormality.  Atrophy with small vessel ischemic changes and intracranial atherosclerosis.  CT CERVICAL SPINE  Findings: Normal cervical lordosis.  No evidence of fracture or dislocation.  Vertebral body heights are  maintained.  The dens appears intact.  No prevertebral soft tissue swelling.  Moderate multilevel degenerative changes.  Visualized thyroid is unremarkable.  Vascular calcifications.  Visualized lung apices are notable for biapical pleural parenchymal scarring.  IMPRESSION: No evidence of traumatic injury to the cervical spine.  Moderate multilevel degenerative changes.  Original Report Authenticated By: Charline Bills, M.D.   Dg Hand Complete Right  07/25/2011  *RADIOLOGY REPORT*  Clinical Data: Fall, knot  RIGHT HAND - COMPLETE 3+ VIEW  Comparison: None  Findings: Prior ORIF distal right radius. Diffuse osseous demineralization. Scattered narrowing of interphalangeal joints. Slight flexion deformity DIP joint little finger. Additional narrowing seen throughout the MCP joints as well. Degenerative changes at radiocarpal joint, distal pole scaphoid and at first [facility] joint. No acute fracture, dislocation, or bone destruction. Dorsal soft tissue swelling identified at the level of the MCP joints.  IMPRESSION: Osseous demineralization. Scattered degenerative changes. Prior ORIF distal radius. No acute bony abnormalities.  Original Report Authenticated By: Lollie Marrow, M.D.     No diagnosis found.    MDM  Scalp contusion        Nicholes Stairs, MD 07/25/11 1526

## 2011-07-25 NOTE — ED Notes (Signed)
Patient right hand hematoma skin intact pain 0/10. Left hand 0.25 skin tear no bleeding present cleaned with wound cleanser and applied bandage. Patient tolerated procedure without incident.

## 2011-07-25 NOTE — ED Notes (Signed)
Ambulating tripped over a curb fell to the ground bumped head onto concrete parking block.  Patient walking with sister who also fell.  Denies LOC. Hematoma right side of head above right ear. Left 3rd finger base abrasion bleeding controlled. Ax3. Patient current taking plavix.

## 2011-09-24 ENCOUNTER — Other Ambulatory Visit: Payer: Self-pay | Admitting: Dermatology

## 2011-11-26 ENCOUNTER — Other Ambulatory Visit: Payer: Self-pay | Admitting: Cardiology

## 2012-04-01 IMAGING — CT CT CERVICAL SPINE W/O CM
5 of 6 series · 14 of 33 positions shown, 16 images · non-contrast
Comparison: None

CT HEAD

CLINICAL DATA: Trauma/fall, hit head, hematoma above right ear

CT HEAD WITHOUT CONTRAST
CT CERVICAL SPINE WITHOUT CONTRAST
TECHNIQUE: Multidetector CT imaging of the head and cervical spine
was performed following the standard protocol without intravenous
contrast.  Multiplanar CT image reconstructions of the cervical
spine were also generated.

[Series 4: head trauma 2.4 h60s · axial · 0.46mm/px · z∈[-79,-20]mm · 2 of 72 slices shown]
[im 24/72  bone]
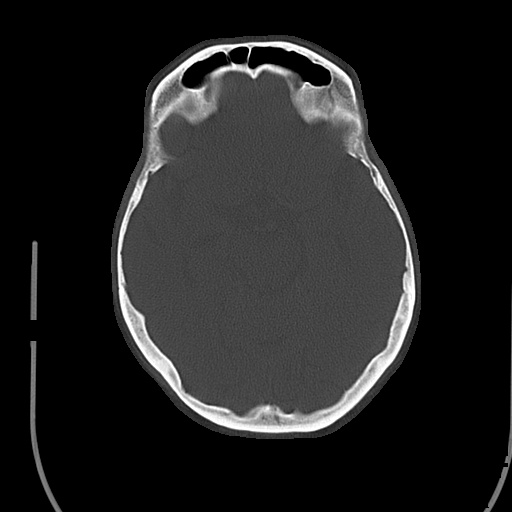
[im 48/72  bone]
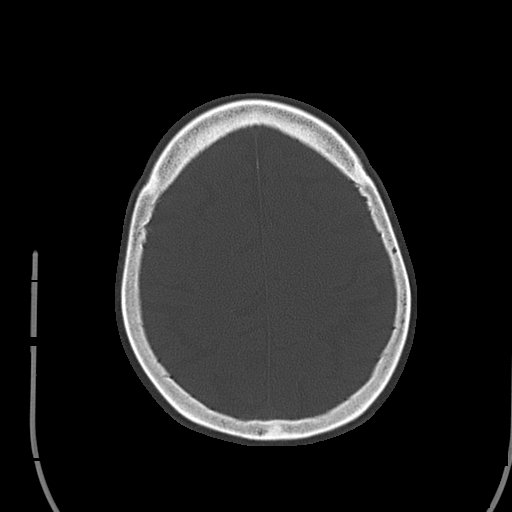

[Series 6: c_spine 2.0 b31s detail · axial · 0.25mm/px · z∈[-228,-170]mm · 2 of 89 slices shown, 3 images]
[im 30/89  soft-tissue]
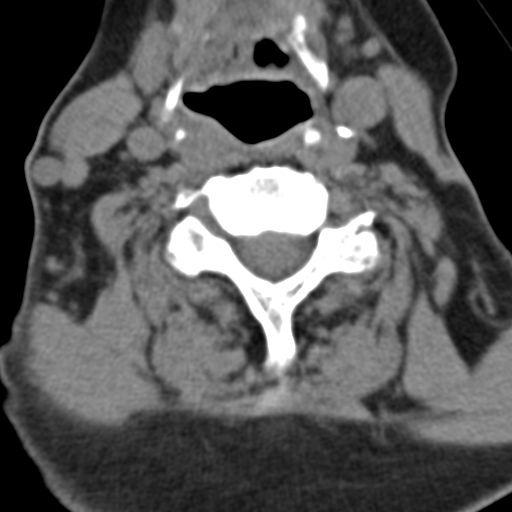
[im 30/89  bone]
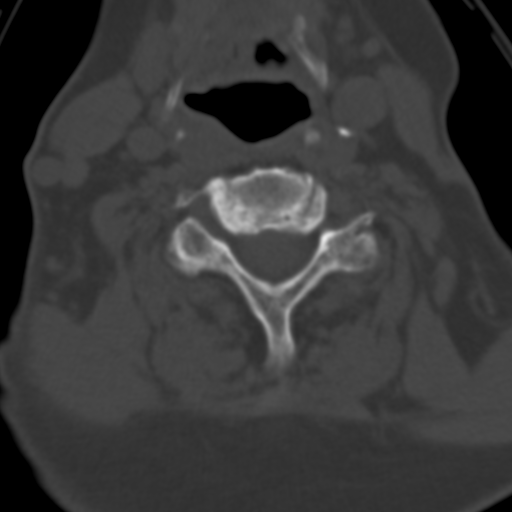
[im 59/89  bone]
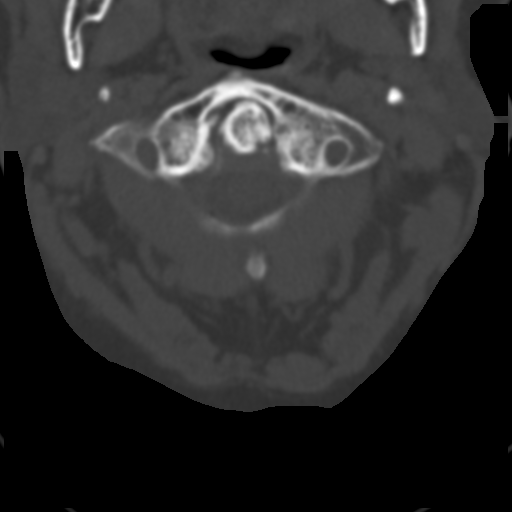

[Series 602: orthog · axial · 0.35mm/px · z∈[-260,-207]mm · 2 of 86 slices shown]
[im 29/86  bone]
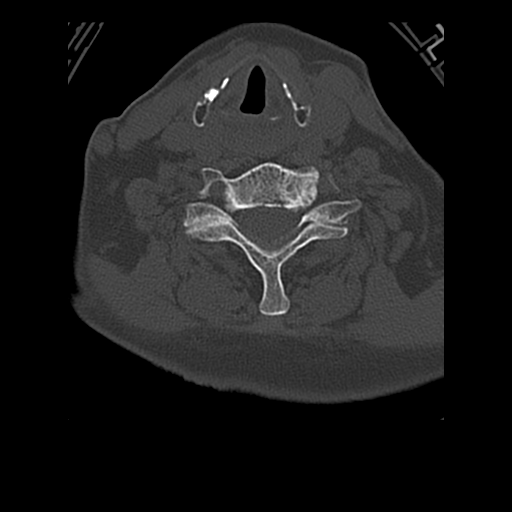
[im 57/86  bone]
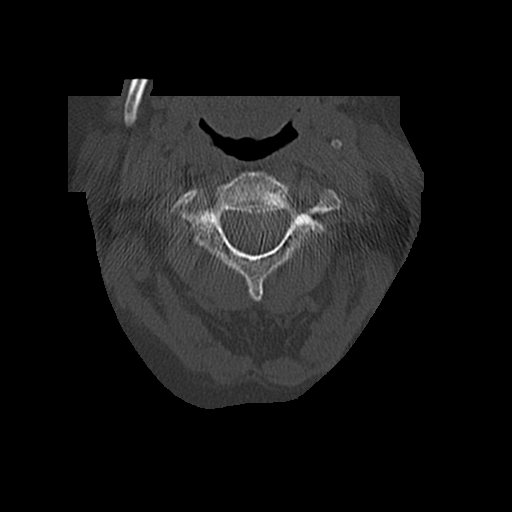

[Series 603: cor · coronal · 0.35mm/px · 3 of 35 slices shown]
[im 7/35  bone]
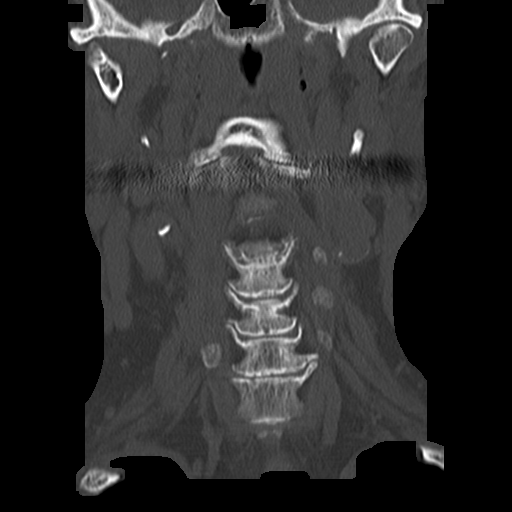
[im 14/35  bone]
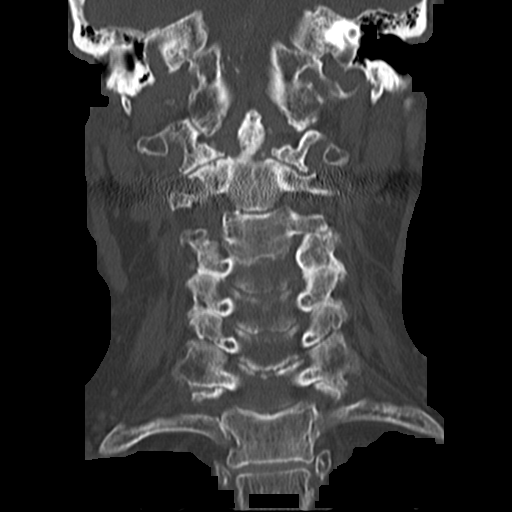
[im 21/35  bone]
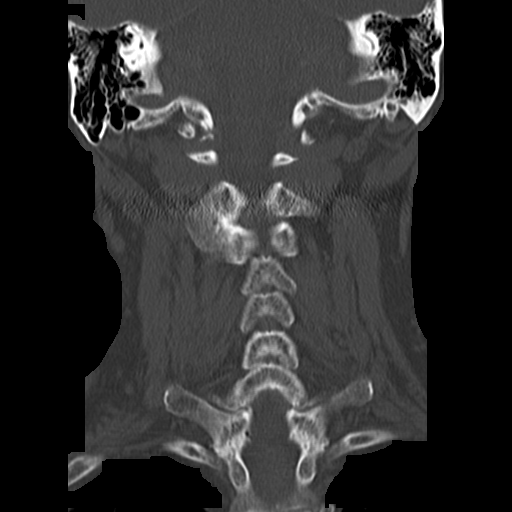

[Series 604: sag · sagittal · 0.35mm/px · 5 of 43 slices shown, 6 images]
[im 15/43  bone]
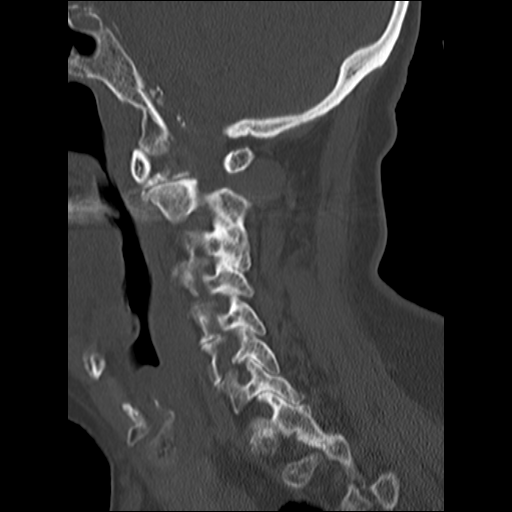
[im 18/43  bone]
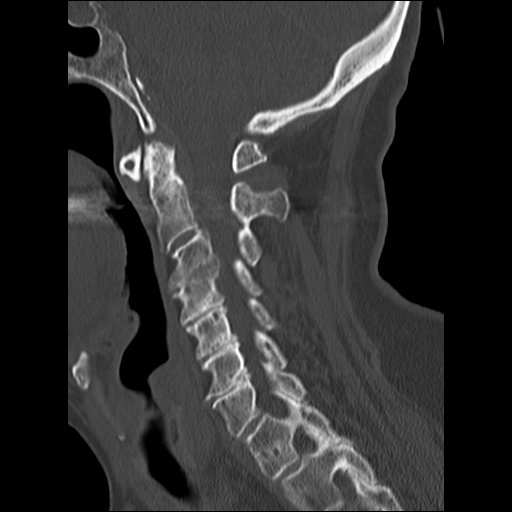
[im 22/43  soft-tissue]
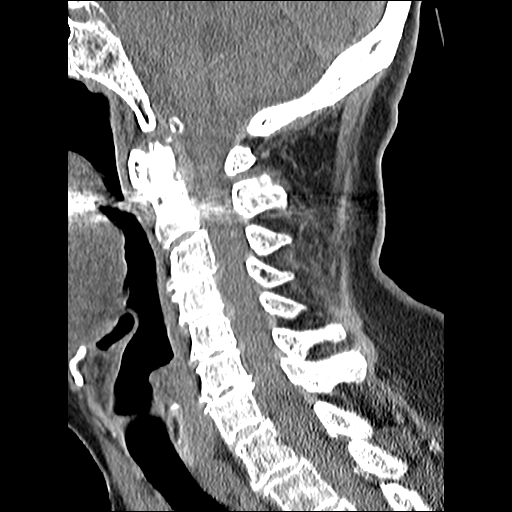
[im 22/43  bone]
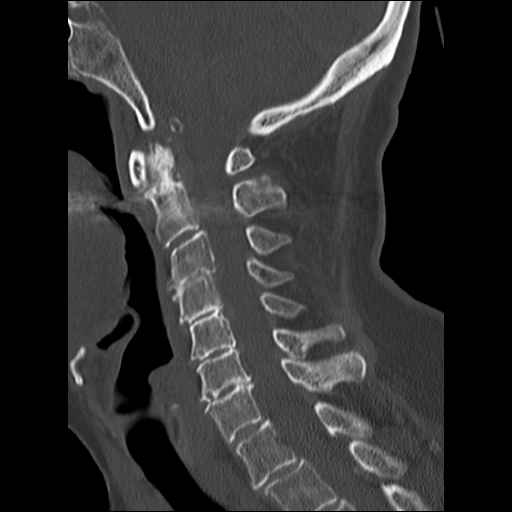
[im 25/43  bone]
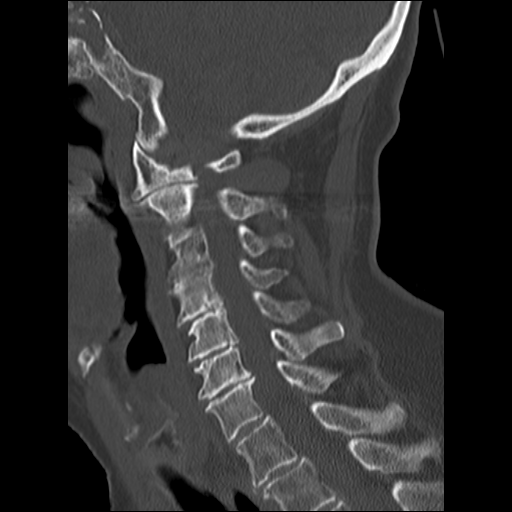
[im 29/43  bone]
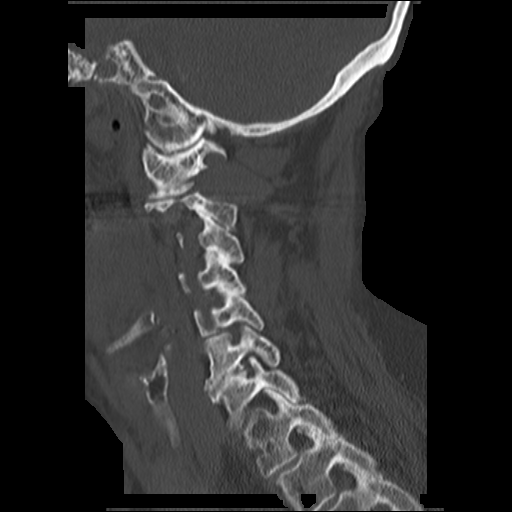

[14 of 33 positions shown; findings below may reference images not displayed]

FINDINGS: No evidence of parenchymal hemorrhage or extra-axial
fluid collection. No mass lesion, mass effect, or midline shift.

No CT evidence of acute infarction.

Subcortical white matter and periventricular small vessel ischemic
changes.

Age related atrophy with secondary ventricular prominence.

Intracranial atherosclerosis.

The visualized paranasal sinuses are essentially clear. The mastoid
air cells are unopacified.

No evidence of calvarial fracture.
IMPRESSION: No evidence of acute intracranial abnormality.

Atrophy with small vessel ischemic changes and intracranial
atherosclerosis.

CT CERVICAL SPINE
FINDINGS: Normal cervical lordosis.

No evidence of fracture or dislocation.  Vertebral body heights are
maintained.  The dens appears intact.

No prevertebral soft tissue swelling.

Moderate multilevel degenerative changes.

Visualized thyroid is unremarkable.

Vascular calcifications.

Visualized lung apices are notable for biapical pleural parenchymal
scarring.
IMPRESSION: No evidence of traumatic injury to the cervical spine.

Moderate multilevel degenerative changes.

## 2012-04-23 ENCOUNTER — Other Ambulatory Visit: Payer: Self-pay | Admitting: Cardiology

## 2012-12-09 ENCOUNTER — Encounter: Payer: Self-pay | Admitting: Geriatric Medicine

## 2012-12-09 ENCOUNTER — Non-Acute Institutional Stay: Payer: Medicare Other | Admitting: Geriatric Medicine

## 2012-12-09 VITALS — BP 144/80 | HR 84 | Temp 98.9°F | Ht 63.0 in | Wt 143.0 lb

## 2012-12-09 DIAGNOSIS — J029 Acute pharyngitis, unspecified: Secondary | ICD-10-CM | POA: Insufficient documentation

## 2012-12-14 ENCOUNTER — Encounter: Payer: Self-pay | Admitting: Geriatric Medicine

## 2012-12-14 NOTE — Progress Notes (Signed)
Patient ID: [name], female   DOB: 1923/09/07, 79 y.o.   MRN: [id number]    Chief Complaint  Patient presents with  . Sore Throat    cough for 4 days    HPI  This 79 year old female resident of [facility], independent living section presented to the clinic this afternoon for evaluation of sore throat she's had for 4 days along with cough. Her family requested this evaluation due these symptoms as well as increased confusion. Pt reports she was at a party Saturday evening(sister's 80th birthday), sore throat started after that. Sister is also ill.  Allergies Reviewed   Medications Reviewed   Data Reviewed    Radiology:   Lab [facility], external)  4/16.2013  Lipid: cholesterol 238, Triglycerides 82, HDL 78, LDL 144  05/26/12 WBC 7.5, Hgb 14.9, Hct 44.4, Plt 231   Glu 61, BUN 30, Cr. .77, Na 142, K+ 4.2. Protein/ LFTs WNL   TSH 1.30 B12 839   U/A neg for signs of infection   Other:   REVIEW of SYSTEMS:   DATA OBTAINED: from patient, nurse, family member. GENERAL: Feels tired No fevers, . No change in appetite, weight  SKIN: No itch, rash or open wounds EYES: No eye pain, dryness or itching. No change in vision EARS: No earache, tinnitus, change in hearing NOSE: No congestion, drainage or bleeding MOUTH/THROAT: No mouth or tooth pain. No difficulty chewing or swallowing.Sore throat RESPIRATORY: Cough present, no wheezing, SOB CARDIAC: No chest pain, palpitations. No edema. GI: No abdominal pain. No N/V/D or constipation. No heartburn or reflux.  GU: No dysuria, frequency or urgency.  No change in urine volume or character MUSCULOSKELETAL: No joint pain, swelling or stiffness. No back pain. No muscle ache, pain, weakness. Gait is steady. No recent falls.  NEUROLOGIC: No dizziness, fainting, headache, imbalance, numbness. No change in mental status.  PSYCHIATRIC: No anxiety, depression, behavior issue. Sleeps well.    PHYSICAL EXAM:  Filed Vitals:   12/09/12  1636  BP: 144/80  Pulse: 84  Temp: 98.9 F (37.2 C)   Filed Weights   12/09/12 1636  Weight: 143 lb (64.864 kg)    GENERAL APPEARANCE: No acute distress, appropriately groomed, normal body habitus. Alert, pleasant, conversant. HEAD: Normocephalic, atraumatic EYES: Conjunctiva/lids clear. Pupils round, reactive. EOMs intact.  EARS: External exam WNL, canals clear, TM WNL. Very HOH NOSE: No deformity or discharge. MOUTH/THROAT: Lips w/o lesions. Oral mucosa, tongue moist, w/o lesion. Oropharynx is red   NECK: Supple, full ROM. No thyroid tenderness, enlargement or nodule LYMPHATICS: No head, neck or supraclavicular adenopathy RESPIRATORY: Breathing is even, unlabored. Lung sounds are clear and full.  CARDIOVASCULAR: Heart RRR. No murmur or extra heart sounds   EDEMA: No peripheral edema.  MUSCULOSKELETAL: Moves all extremities with full ROM, strength and tone.  NEUROLOGIC: Oriented to time, place, person.  PSYCHIATRIC: Mood and affect appropriate to situation  ASSESSMENT/PLAN  Acute pharyngitis Sore throat, non productive cough, fatigue, poor appetite. Treat symptomatically. Likely simple pharyngitis, but staff/ family are concerned about increased confusion. Obtain CBC, BMP. Spoke with pt's family- they have arranged private caregivers for part of each day for the next 3-4 days.   Follow up: Scheduled appt in May w/ Dr.Green or as needed if symptoms do not inmprove  Tran Randle T.Hyun Marsalis, NP-C

## 2012-12-14 NOTE — Assessment & Plan Note (Signed)
Sore throat, non productive cough, fatigue, poor appetite. Treat symptomatically. Likely simple pharyngitis, but staff/ family are concerned about increased confusion. Obtain CBC, BMP. Spoke with pt's family- they have arranged private caregivers for part of each day for the next 3-4 days.

## 2013-02-01 ENCOUNTER — Non-Acute Institutional Stay: Payer: Medicare Other | Admitting: Internal Medicine

## 2013-02-01 VITALS — BP 110/62 | HR 62 | Ht 63.0 in | Wt 147.0 lb

## 2013-02-01 DIAGNOSIS — R413 Other amnesia: Secondary | ICD-10-CM | POA: Insufficient documentation

## 2013-02-01 DIAGNOSIS — F329 Major depressive disorder, single episode, unspecified: Secondary | ICD-10-CM

## 2013-02-01 DIAGNOSIS — G5602 Carpal tunnel syndrome, left upper limb: Secondary | ICD-10-CM

## 2013-02-01 DIAGNOSIS — I2510 Atherosclerotic heart disease of native coronary artery without angina pectoris: Secondary | ICD-10-CM

## 2013-02-01 DIAGNOSIS — I10 Essential (primary) hypertension: Secondary | ICD-10-CM | POA: Insufficient documentation

## 2013-02-01 DIAGNOSIS — G5600 Carpal tunnel syndrome, unspecified upper limb: Secondary | ICD-10-CM | POA: Insufficient documentation

## 2013-02-01 DIAGNOSIS — R350 Frequency of micturition: Secondary | ICD-10-CM

## 2013-02-01 DIAGNOSIS — E785 Hyperlipidemia, unspecified: Secondary | ICD-10-CM

## 2013-02-01 DIAGNOSIS — G4700 Insomnia, unspecified: Secondary | ICD-10-CM

## 2013-02-01 NOTE — Progress Notes (Signed)
Subjective:    Patient ID: [name], female    DOB: [birth year], 79 y.o.   MRN: [id number]  HPI Says she gets dizzy after she takes her medications in the morning. Only lasts a half hour. Acute respiratory illness in March resolved. Patient is to have cataract surgery. Hypertension: Controlled  Depression: Unchanged. Patient is beginning to get out and do some things socially  Carpal tunnel syndrome, left: Improved  Insomnia, unspecified: Unchanged  Hyperlipidemia: Stable  Mild memory disturbance: Stable  Coronary atherosclerosis of native coronary artery: No chest pains  Urinary frequency: Was tried on Myrbetriq without success. She does not want to try more medications.     Review of Systems  Constitutional: Positive for fatigue. Negative for fever, chills, diaphoresis, appetite change and unexpected weight change.  HENT: Positive for hearing loss and tinnitus. Negative for ear pain.        Uses hearing aids.  Eyes: Negative.   Respiratory: Negative.   Cardiovascular: Negative.   Gastrointestinal: Negative.   Genitourinary:       Has some urinary leakage. Nocturia x2-3. Wears a protective pad and daytime and Depends incontinence briefs at night.  Musculoskeletal: Negative.   Skin: Negative.        Is complaining of dry itchy skin in the past.  Neurological: Negative for dizziness, speech difficulty, light-headedness and headaches.       Has mild memory loss. Still capable of functioning independently.  Psychiatric/Behavioral: Negative for suicidal ideas and hallucinations. The patient is nervous/anxious.        Chronic generalized anxiety syndrome. Some depression related to the loss of her husband over a year ago. She has insomnia. Her mood swings. There are attacks of increased nervousness verging on panic attacks. She feels stressed, fearful, and becomes obsessed with small things that she needs to do. She has difficulty with sequencing events regarding things  that she needs to do.       Objective: BP 110/62  Pulse 62  Ht 5\' 3"  (1.6 m)  Wt 147 lb (66.679 kg)  BMI 26.05 kg/m2    Physical Exam  Constitutional: She is oriented to person, place, and time. She appears well-developed and well-nourished. No distress.  Frail elderly female.  HENT:  Head: Normocephalic and atraumatic.  Nose: Nose normal.  Mouth/Throat: No oropharyngeal exudate.  Chronic hearing loss bilaterally.  Neck: Normal range of motion. Neck supple. No JVD present. No tracheal deviation present. No thyromegaly present.  Cardiovascular: Normal rate, regular rhythm, normal heart sounds and intact distal pulses.  Exam reveals no gallop and no friction rub.   No murmur heard. Absent popliteal pulses  Pulmonary/Chest: Effort normal and breath sounds normal. No respiratory distress. She has no wheezes. She has no rales. She exhibits no tenderness.  Abdominal: Soft. Bowel sounds are normal. She exhibits no distension and no mass. There is no tenderness.  Musculoskeletal: Normal range of motion. She exhibits no edema and no tenderness.  Lymphadenopathy:    She has no cervical adenopathy.  Neurological: She is alert and oriented to person, place, and time. No cranial nerve deficit. Coordination normal.  Unable to elicit a positive Phalen's sign or Tinel sign.  Skin: Skin is warm and dry. No rash noted. No erythema. No pallor.  Psychiatric: She has a normal mood and affect. Her behavior is normal. Thought content normal.    Laboratory reports 12/09/2012 CMP: Sodium 134, glucose 101, BUN 26, creatinine 0.78  CBC normal. Hemoglobin 14.6. 01/26/2013 CMP: Normal except BUN 31  Lipids: TC 222, trig 114, HDL 61, LDL 138  TSH 1.567      Assessment & Plan:  1. Hypertension Controlled  2. Depression Stable. Patient is not interested in changes in medication at this time.  3. Carpal tunnel syndrome, left Improved  4. Insomnia, unspecified Stable  5. Hyperlipidemia Patient  is not interested in drug therapy. She will continue to work with her diet. At her age, I don't think cholesterol-lowering agents are essential.  6. Mild memory disturbance Unchanged  7. Coronary atherosclerosis of native coronary artery Denies angina  8. Urinary frequency Patient has failed on several trials of anti-spasmodic medications for the bladder. She is not interested in trying additional medicines at this time.

## 2013-02-19 ENCOUNTER — Ambulatory Visit
Admission: RE | Admit: 2013-02-19 | Discharge: 2013-02-19 | Disposition: A | Discharge status: Home / Self Care | Payer: Medicare Other | Source: Ambulatory Visit | Attending: Geriatric Medicine | Admitting: Geriatric Medicine

## 2013-02-19 ENCOUNTER — Other Ambulatory Visit: Payer: Self-pay | Admitting: Geriatric Medicine

## 2013-02-19 DIAGNOSIS — M25551 Pain in right hip: Secondary | ICD-10-CM

## 2013-02-19 DIAGNOSIS — M2560 Stiffness of unspecified joint, not elsewhere classified: Secondary | ICD-10-CM

## 2013-02-21 NOTE — Patient Instructions (Signed)
Continue current medications. 

## 2013-03-03 ENCOUNTER — Encounter: Payer: Self-pay | Admitting: Internal Medicine

## 2013-03-16 HISTORY — PX: CATARACT EXTRACTION: SUR2

## 2013-04-19 ENCOUNTER — Non-Acute Institutional Stay: Payer: Medicare Other | Admitting: Internal Medicine

## 2013-04-19 VITALS — BP 148/78 | HR 76 | Ht 63.0 in | Wt 146.0 lb

## 2013-04-19 DIAGNOSIS — F329 Major depressive disorder, single episode, unspecified: Secondary | ICD-10-CM

## 2013-04-19 DIAGNOSIS — R413 Other amnesia: Secondary | ICD-10-CM

## 2013-04-19 DIAGNOSIS — M25569 Pain in unspecified knee: Secondary | ICD-10-CM

## 2013-04-19 DIAGNOSIS — I10 Essential (primary) hypertension: Secondary | ICD-10-CM

## 2013-04-19 DIAGNOSIS — M25561 Pain in right knee: Secondary | ICD-10-CM

## 2013-04-19 NOTE — Progress Notes (Signed)
Subjective:    Patient ID: [name], female    DOB: 1923/03/17, 79 y.o.   MRN: [id number]  HPI  Pain in joint, lower leg, right; chronic. No increase in swelling. Rainy weather makes the pain worse. Using gin soaked raisins, 9 daily.  Mild memory disturbance: unchanged  Hypertension: mild SBP elevation  Depression: unchanged.    Current Outpatient Prescriptions on File Prior to Visit  Medication Sig Dispense Refill  . aspirin 81 MG tablet Take 81 mg by mouth daily.      . calcium carbonate (TUMS - DOSED IN MG ELEMENTAL CALCIUM) 500 MG chewable tablet Chew 1 tablet by mouth daily.      . citalopram (CELEXA) 40 MG tablet Take 40 mg by mouth daily.      Marland Kitchen Clopidogrel Bisulfate (PLAVIX PO) Take by mouth.        . metoprolol tartrate (LOPRESSOR) 25 MG tablet 1 tablet. Twice daily to control blood pressure      . Multiple Vitamin (MULTIVITAMIN) capsule Take 1 capsule by mouth daily.      Marland Kitchen omega-3 acid ethyl esters (LOVAZA) 1 G capsule Take 1 g by mouth 2 (two) times daily.      . Red Yeast Rice 600 MG TABS Take 600 mg by mouth. Take one twice daily      . thiamine (VITAMIN B-1) 100 MG tablet Take 100 mg by mouth daily.       No current facility-administered medications on file prior to visit.     Review of Systems  Constitutional: Positive for fatigue. Negative for fever, chills, diaphoresis, appetite change and unexpected weight change.  HENT: Positive for hearing loss and tinnitus. Negative for ear pain.        Uses hearing aids.  Eyes: Negative.        Had bilateral cataract removal in the last month  Respiratory: Negative.   Cardiovascular: Negative.   Gastrointestinal: Negative.   Genitourinary:       Has some urinary leakage. Nocturia x2-3. Wears a protective pad and daytime and Depends incontinence briefs at night.  Musculoskeletal:       Right knee pain  Skin: Negative.        Dry itchy skin. New lesion on the left forearm that looks like a SCC.  Neurological:  Negative for dizziness, speech difficulty, light-headedness and headaches.       Has mild memory loss. Still capable of functioning independently.  Psychiatric/Behavioral: Negative for suicidal ideas and hallucinations. The patient is nervous/anxious.        Chronic generalized anxiety syndrome. Some depression related to the loss of her husband over a year ago. She has insomnia. Her mood swings. There are attacks of increased nervousness verging on panic attacks. She feels stressed, fearful, and becomes obsessed with small things that she needs to do. She has difficulty with sequencing events regarding things that she needs to do.       Objective:BP 148/78  Pulse 76  Ht 5\' 3"  (1.6 m)  Wt 146 lb (66.225 kg)  BMI 25.87 kg/m2    Physical Exam  Constitutional: She is oriented to person, place, and time. She appears well-developed and well-nourished. No distress.  Frail elderly female.  HENT:  Head: Normocephalic and atraumatic.  Nose: Nose normal.  Mouth/Throat: No oropharyngeal exudate.  Chronic hearing loss bilaterally.  Neck: Normal range of motion. Neck supple. No JVD present. No tracheal deviation present. No thyromegaly present.  Cardiovascular: Normal rate, regular rhythm, normal heart sounds  and intact distal pulses.  Exam reveals no gallop and no friction rub.   No murmur heard. Absent popliteal pulses  Pulmonary/Chest: Effort normal and breath sounds normal. No respiratory distress. She has no wheezes. She has no rales. She exhibits no tenderness.  Abdominal: Soft. Bowel sounds are normal. She exhibits no distension and no mass. There is no tenderness.  Musculoskeletal: Normal range of motion. She exhibits no edema and no tenderness.  Lymphadenopathy:    She has no cervical adenopathy.  Neurological: She is alert and oriented to person, place, and time. No cranial nerve deficit. Coordination normal.  Unable to elicit a positive Phalen's sign or Tinel sign.  Skin: Skin is warm  and dry. No rash noted. No erythema. No pallor.  Psychiatric: She has a normal mood and affect. Her behavior is normal. Thought content normal.          Assessment & Plan:  Pain in joint, lower leg, right: chronic. She does not want referral.  Mild memory disturbance: stable  Hypertension: stable  Depression: unchanged. Using Celexa.

## 2013-04-28 ENCOUNTER — Encounter: Payer: Medicare Other | Admitting: Geriatric Medicine

## 2013-04-30 ENCOUNTER — Other Ambulatory Visit: Payer: Self-pay | Admitting: Internal Medicine

## 2013-05-04 ENCOUNTER — Other Ambulatory Visit: Payer: Self-pay | Admitting: Geriatric Medicine

## 2013-05-04 MED ORDER — OMEGA-3-ACID ETHYL ESTERS 1 G PO CAPS: 1.0000 g | ORAL_CAPSULE | Freq: Every day | ORAL | Status: DC

## 2013-05-04 MED ORDER — RED YEAST RICE 600 MG PO TABS: 600.0000 mg | ORAL_TABLET | Freq: Every day | ORAL | Status: DC

## 2013-05-04 MED ORDER — METOPROLOL SUCCINATE ER 50 MG PO TB24: 50.0000 mg | ORAL_TABLET | Freq: Every day | ORAL | Status: DC

## 2013-05-04 NOTE — Progress Notes (Signed)
Message from Darcel Bayley, RN who manages [name] medications. She reports patient is not taking her p.m. medications at least 3 nights a week. She asks if we could consolidate these medicines. Will DC some supplements likely are not providing significant benefit. Will change metoprolol tartrate to succinate formulation so it can be dosed once a day. I notice Myrbetriq is listed on [facility]'s list, is not part of our medication profile

## 2013-06-26 ENCOUNTER — Emergency Department (HOSPITAL_COMMUNITY)
Admission: EM | Admit: 2013-06-26 | Discharge: 2013-06-26 | Disposition: A | Discharge status: Home / Self Care | Payer: Medicare Other | Attending: Emergency Medicine | Admitting: Emergency Medicine

## 2013-06-26 ENCOUNTER — Emergency Department (HOSPITAL_COMMUNITY): Payer: Medicare Other

## 2013-06-26 ENCOUNTER — Encounter (HOSPITAL_COMMUNITY): Payer: Self-pay | Admitting: Emergency Medicine

## 2013-06-26 DIAGNOSIS — Z8669 Personal history of other diseases of the nervous system and sense organs: Secondary | ICD-10-CM | POA: Insufficient documentation

## 2013-06-26 DIAGNOSIS — Z87891 Personal history of nicotine dependence: Secondary | ICD-10-CM | POA: Insufficient documentation

## 2013-06-26 DIAGNOSIS — G4700 Insomnia, unspecified: Secondary | ICD-10-CM | POA: Insufficient documentation

## 2013-06-26 DIAGNOSIS — Z8673 Personal history of transient ischemic attack (TIA), and cerebral infarction without residual deficits: Secondary | ICD-10-CM | POA: Insufficient documentation

## 2013-06-26 DIAGNOSIS — F0390 Unspecified dementia without behavioral disturbance: Secondary | ICD-10-CM | POA: Insufficient documentation

## 2013-06-26 DIAGNOSIS — H905 Unspecified sensorineural hearing loss: Secondary | ICD-10-CM | POA: Insufficient documentation

## 2013-06-26 DIAGNOSIS — Y9389 Activity, other specified: Secondary | ICD-10-CM | POA: Insufficient documentation

## 2013-06-26 DIAGNOSIS — I252 Old myocardial infarction: Secondary | ICD-10-CM | POA: Insufficient documentation

## 2013-06-26 DIAGNOSIS — F329 Major depressive disorder, single episode, unspecified: Secondary | ICD-10-CM | POA: Insufficient documentation

## 2013-06-26 DIAGNOSIS — Y92009 Unspecified place in unspecified non-institutional (private) residence as the place of occurrence of the external cause: Secondary | ICD-10-CM | POA: Insufficient documentation

## 2013-06-26 DIAGNOSIS — Z9889 Other specified postprocedural states: Secondary | ICD-10-CM | POA: Insufficient documentation

## 2013-06-26 DIAGNOSIS — Z7902 Long term (current) use of antithrombotics/antiplatelets: Secondary | ICD-10-CM | POA: Insufficient documentation

## 2013-06-26 DIAGNOSIS — Z951 Presence of aortocoronary bypass graft: Secondary | ICD-10-CM | POA: Insufficient documentation

## 2013-06-26 DIAGNOSIS — F411 Generalized anxiety disorder: Secondary | ICD-10-CM | POA: Insufficient documentation

## 2013-06-26 DIAGNOSIS — I10 Essential (primary) hypertension: Secondary | ICD-10-CM | POA: Insufficient documentation

## 2013-06-26 DIAGNOSIS — I2510 Atherosclerotic heart disease of native coronary artery without angina pectoris: Secondary | ICD-10-CM | POA: Insufficient documentation

## 2013-06-26 DIAGNOSIS — R296 Repeated falls: Secondary | ICD-10-CM | POA: Insufficient documentation

## 2013-06-26 DIAGNOSIS — R5381 Other malaise: Secondary | ICD-10-CM | POA: Insufficient documentation

## 2013-06-26 DIAGNOSIS — S8000XA Contusion of unspecified knee, initial encounter: Secondary | ICD-10-CM | POA: Insufficient documentation

## 2013-06-26 DIAGNOSIS — Z872 Personal history of diseases of the skin and subcutaneous tissue: Secondary | ICD-10-CM | POA: Insufficient documentation

## 2013-06-26 DIAGNOSIS — Z853 Personal history of malignant neoplasm of breast: Secondary | ICD-10-CM | POA: Insufficient documentation

## 2013-06-26 DIAGNOSIS — W19XXXA Unspecified fall, initial encounter: Secondary | ICD-10-CM

## 2013-06-26 DIAGNOSIS — M810 Age-related osteoporosis without current pathological fracture: Secondary | ICD-10-CM | POA: Insufficient documentation

## 2013-06-26 DIAGNOSIS — M1990 Unspecified osteoarthritis, unspecified site: Secondary | ICD-10-CM | POA: Insufficient documentation

## 2013-06-26 DIAGNOSIS — Z7982 Long term (current) use of aspirin: Secondary | ICD-10-CM | POA: Insufficient documentation

## 2013-06-26 DIAGNOSIS — Z79899 Other long term (current) drug therapy: Secondary | ICD-10-CM | POA: Insufficient documentation

## 2013-06-26 DIAGNOSIS — Z87448 Personal history of other diseases of urinary system: Secondary | ICD-10-CM | POA: Insufficient documentation

## 2013-06-26 LAB — CBC WITH DIFFERENTIAL/PLATELET
Basophils Absolute: 0 10*3/uL (ref 0.0–0.1)
Eosinophils Relative: 0 % (ref 0–5)
HCT: 42.9 % (ref 36.0–46.0)
Hemoglobin: 15.1 g/dL — ABNORMAL HIGH (ref 12.0–15.0)
Lymphocytes Relative: 4 % — ABNORMAL LOW (ref 12–46)
Lymphs Abs: 0.5 10*3/uL — ABNORMAL LOW (ref 0.7–4.0)
MCHC: 35.2 g/dL (ref 30.0–36.0)
MCV: 88.8 fL (ref 78.0–100.0)
Monocytes Absolute: 0.5 10*3/uL (ref 0.1–1.0)
Monocytes Relative: 4 % (ref 3–12)
Neutro Abs: 11.5 10*3/uL — ABNORMAL HIGH (ref 1.7–7.7)
Neutrophils Relative %: 91 % — ABNORMAL HIGH (ref 43–77)
RBC: 4.83 MIL/uL (ref 3.87–5.11)
WBC: 12.6 10*3/uL — ABNORMAL HIGH (ref 4.0–10.5)

## 2013-06-26 LAB — POCT I-STAT, CHEM 8
Chloride: 105 mEq/L (ref 96–112)
Creatinine, Ser: 0.9 mg/dL (ref 0.50–1.10)
Glucose, Bld: 106 mg/dL — ABNORMAL HIGH (ref 70–99)
HCT: 47 % — ABNORMAL HIGH (ref 36.0–46.0)
Hemoglobin: 16 g/dL — ABNORMAL HIGH (ref 12.0–15.0)
Potassium: 3.7 mEq/L (ref 3.5–5.1)
Sodium: 139 mEq/L (ref 135–145)

## 2013-06-26 LAB — URINALYSIS, ROUTINE W REFLEX MICROSCOPIC
Bilirubin Urine: NEGATIVE
Glucose, UA: NEGATIVE mg/dL
Ketones, ur: 15 mg/dL — AB
Nitrite: NEGATIVE
Specific Gravity, Urine: 1.016 (ref 1.005–1.030)
pH: 7.5 (ref 5.0–8.0)

## 2013-06-26 LAB — CK: Total CK: 86 U/L (ref 7–177)

## 2013-06-26 MED ORDER — SODIUM CHLORIDE 0.9 % IV BOLUS (SEPSIS)
500.0000 mL | Freq: Once | INTRAVENOUS | Status: AC
  Administered 2013-06-26: 500 mL via INTRAVENOUS

## 2013-06-26 NOTE — ED Notes (Signed)
Pt arrived from [facility] by [employer]. Security at [facility] center heard pt yelling and found pt lying on the floor. Pt was attempting to go to the bathroom. EMS arrived and she was sitting up in chair. No c/o pain. Stroke scale negative. Facility nurse concerned about right hand "drawn up". Pt able to grib and straighten out hand. Pt A&0x4. Pt sister at bedside stated last night when they got home from supper and became a little off balance and started leaning back but was able to catch herself.  Denies any dizziness and no LOC. Pt has hx of HTN and has not had morning medications. BP 216/111 HR-84 Resp-16 [id number]

## 2013-06-26 NOTE — ED Notes (Signed)
Patient Returned from XR.

## 2013-06-26 NOTE — ED Provider Notes (Signed)
CSN: [id number]     Arrival date & time 06/26/13  1103 History   First MD Initiated Contact with Patient 06/26/13 1103     Chief Complaint  Patient presents with  . Fall   (Consider location/radiation/quality/duration/timing/severity/associated sxs/prior Treatment) Patient is a 79 y.o. female presenting with fall. The history is provided by the patient and a relative.  Fall This is a new problem. The current episode started 6 to 12 hours ago. The problem occurs constantly. The problem has been resolved. Pertinent negatives include no headaches and no shortness of breath. Associated symptoms comments: Pt states she fell when trying to go to the bathroom last night when it was still dark out and could not get up.  Denies focal weakness, LOC or headache.  Pt now just generally weak.. Nothing aggravates the symptoms. Nothing relieves the symptoms. She has tried nothing for the symptoms. The treatment provided no relief.    Past Medical History  Diagnosis Date  . Hypertension   . Dementia   . Depression   . Anxiety   . Carpal tunnel syndrome   . Insomnia, unspecified   . Other malaise and fatigue   . Unspecified urinary incontinence   . Hyperlipidemia     Pt declines medication  . Cancer   . Malignant neoplasm of lower-outer quadrant of female breast 1998    Left  . Mild memory disturbance 2012  . Myocardial infarction 2012  . Senile osteoporosis 2012  . TIA (transient ischemic attack) 2012  . Sensorineural hearing loss, unilateral   . Major depressive disorder, single episode, unspecified 07/2011  . Coronary atherosclerosis of native coronary artery 07/2011  . Seborrheic dermatitis, unspecified 07/2011  . Generalized osteoarthrosis, involving multiple sites 07/2011  . Urinary frequency 07/2011   Past Surgical History  Procedure Laterality Date  . Mastectomy Left 1998  . Coronary artery bypass graft  01/2003  . Knee arthroscopy Right 2006  . Total hip arthroplasty Right 2008   . Wrist fracture surgery Right     with internal fixation  . Cataract extraction Bilateral July 2014   Family History  Problem Relation Age of Onset  . Heart disease Mother   . Heart disease Father   . Cancer Sister     Breast cancer  . Cancer Daughter     Leukemia  . Cancer Sister     Colon  . Hypothyroidism Daughter    History  Substance Use Topics  . Smoking status: Former Smoker    Quit date: 12/14/1952  . Smokeless tobacco: Never Used  . Alcohol Use: No   OB History   Grav Para Term Preterm Abortions TAB SAB Ect Mult Living                 Review of Systems  Constitutional: Negative for fever and chills.  Respiratory: Negative for cough and shortness of breath.   Neurological: Positive for weakness. Negative for dizziness, facial asymmetry, light-headedness, numbness and headaches.  All other systems reviewed and are negative.    Allergies  Caffeine; Cardizem; Diazepam; Halcion; Statins; and Sulfur  Home Medications   Current Outpatient Rx  Name  Route  Sig  Dispense  Refill  . aspirin 81 MG tablet   Oral   Take 81 mg by mouth daily.         . citalopram (CELEXA) 40 MG tablet      TAKE 1 TABLET EVERY MORNING.   90 tablet   3     **Patient  requests 90 days supply**   . Clopidogrel Bisulfate (PLAVIX PO)   Oral   Take by mouth.           . metoprolol succinate (TOPROL-XL) 50 MG 24 hr tablet   Oral   Take 1 tablet (50 mg total) by mouth daily. Take with or immediately following breakfast   90 tablet   3   . Multiple Vitamin (MULTIVITAMIN) capsule   Oral   Take 1 capsule by mouth daily.         Marland Kitchen omega-3 acid ethyl esters (LOVAZA) 1 G capsule   Oral   Take 1 capsule (1 g total) by mouth daily. In AM         . Red Yeast Rice 600 MG TABS   Oral   Take 1 tablet (600 mg total) by mouth daily. Take one twice daily          There were no vitals taken for this visit. Physical Exam  Nursing note and vitals reviewed. Constitutional:  She is oriented to person, place, and time. She appears well-developed and well-nourished. No distress.  HENT:  Head: Normocephalic and atraumatic.  Mouth/Throat: Oropharynx is clear and moist. Mucous membranes are dry.  Eyes: Conjunctivae and EOM are normal. Pupils are equal, round, and reactive to light.  Neck: Normal range of motion. Neck supple. No spinous process tenderness and no muscular tenderness present.  Cardiovascular: Normal rate, regular rhythm and intact distal pulses.   No murmur heard. Pulmonary/Chest: Effort normal and breath sounds normal. No respiratory distress. She has no wheezes. She has no rales.  Abdominal: Soft. She exhibits no distension. There is no tenderness. There is no rebound and no guarding.  Musculoskeletal: Normal range of motion. She exhibits no edema.       Left knee: She exhibits swelling and ecchymosis. She exhibits normal range of motion. Tenderness found. Medial joint line and lateral joint line tenderness noted.  Neurological: She is alert and oriented to person, place, and time. She has normal strength. No sensory deficit.  Skin: Skin is warm and dry. No rash noted. No erythema.  Psychiatric: She has a normal mood and affect. Her behavior is normal.    ED Course  Procedures (including critical care time) Labs Review Labs Reviewed  CBC WITH DIFFERENTIAL - Abnormal; Notable for the following:    WBC 12.6 (*)    Hemoglobin 15.1 (*)    Neutrophils Relative % 91 (*)    Neutro Abs 11.5 (*)    Lymphocytes Relative 4 (*)    Lymphs Abs 0.5 (*)    All other components within normal limits  URINALYSIS, ROUTINE W REFLEX MICROSCOPIC - Abnormal; Notable for the following:    APPearance HAZY (*)    Ketones, ur 15 (*)    All other components within normal limits  POCT I-STAT, CHEM 8 - Abnormal; Notable for the following:    Glucose, Bld 106 (*)    Hemoglobin 16.0 (*)    HCT 47.0 (*)    All other components within normal limits  CK   Imaging  Review No results found.  EKG Interpretation   None       MDM   1. Fall at home, initial encounter     Patient here do to a fall last night at her home and unable to get up. Patient was helped up this morning and was able to ambulate to the bathroom but was generally weak. Also family states her hand was contracted  but now appears normal. Patient denies any weakness, LOC or head injury. Patient does take Plavix regularly for cardiac issues. Patient appears dehydrated on exam but otherwise has no focal signs of injury except for ecchymosis to the left knee. CBC, CK, i-stat and UA pending.  Head CT and knee film pending.  12:11 PM When pt was helped to the bathroom she was unsteady on her feet and required assistance.  No focal weakness.  However pt lives alone.  3:11 PM Pt's labs relatively benign except for mild dehydration but lives alone and [facility] does not have increased level of care for her because they are full.  3:58 PM Pt with care now at home at least for the next 12 hours  Gwyneth Sprout, MD 06/26/13 1558

## 2013-06-26 NOTE — Progress Notes (Signed)
Chaplain paged to ED to provide pastoral and emotional support to patient's family. Per nursing, patient actively dying. Patient's family was also supported by their family pastor.   06/26/13 1600  Clinical Encounter Type  Visited With Patient and family together  Visit Type Initial;ED  Referral From Nurse

## 2013-06-28 ENCOUNTER — Encounter: Payer: Self-pay | Admitting: Internal Medicine

## 2013-06-28 ENCOUNTER — Non-Acute Institutional Stay: Payer: Medicare Other | Admitting: Internal Medicine

## 2013-06-28 VITALS — BP 126/78 | HR 64

## 2013-06-28 DIAGNOSIS — I6789 Other cerebrovascular disease: Secondary | ICD-10-CM

## 2013-06-28 DIAGNOSIS — I69959 Hemiplegia and hemiparesis following unspecified cerebrovascular disease affecting unspecified side: Secondary | ICD-10-CM

## 2013-06-28 DIAGNOSIS — I69359 Hemiplegia and hemiparesis following cerebral infarction affecting unspecified side: Secondary | ICD-10-CM

## 2013-06-28 DIAGNOSIS — I10 Essential (primary) hypertension: Secondary | ICD-10-CM

## 2013-06-28 DIAGNOSIS — R413 Other amnesia: Secondary | ICD-10-CM

## 2013-06-28 NOTE — Progress Notes (Addendum)
Subjective:    Patient ID: [name], female    DOB: Mar 27, 1923, 79 y.o.   MRN: [id number]  HPI Increased confusion on 06/25/13. Speech was a little off with mixed up words. Right side was dragging.  Seemed pretty normal at dinner. Nearly lost balance when she returned home.  Fell on Saturday 06/26/13. Was found on the floor in the bathroom. Now unable to use silverware. Icreased confusion.  Current Outpatient Prescriptions on File Prior to Visit  Medication Sig Dispense Refill  . Acetaminophen (TYLENOL PO) Take by mouth as needed.      Marland Kitchen aspirin 81 MG tablet Take 81 mg by mouth daily.      . Calcium Carbonate-Vitamin D (CALCIUM 600 + D PO) Take 1 tablet by mouth daily.      . citalopram (CELEXA) 40 MG tablet Take 40 mg by mouth daily.      Marland Kitchen Clopidogrel Bisulfate (PLAVIX PO) Take by mouth.        . metoprolol tartrate (LOPRESSOR) 25 MG tablet Take 25 mg by mouth 2 (two) times daily.      . mirabegron ER (MYRBETRIQ) 25 MG TB24 tablet Take 25 mg by mouth daily.      . Multiple Vitamin (MULTIVITAMIN) capsule Take 1 capsule by mouth daily.      Marland Kitchen omega-3 acid ethyl esters (LOVAZA) 1 G capsule Take 1 g by mouth 2 (two) times daily.      . Red Yeast Rice 600 MG TABS Take 1 tablet (600 mg total) by mouth daily. Take one twice daily       No current facility-administered medications on file prior to visit.    Review of Systems  Constitutional: Positive for fatigue. Negative for fever, chills, diaphoresis, appetite change and unexpected weight change.  HENT: Positive for hearing loss and tinnitus. Negative for ear pain.        Uses hearing aids.  Eyes: Negative.        Had bilateral cataract removal in the last month  Respiratory: Negative.   Cardiovascular: Negative.   Gastrointestinal: Negative.   Genitourinary:       Has some urinary leakage. Nocturia x2-3. Wears a protective pad and daytime and Depends incontinence briefs at night.  Musculoskeletal:       Right knee pain  Skin:  Negative.        Dry itchy skin. New lesion on the left forearm that looks like a SCC.  Neurological: Negative for dizziness, speech difficulty, light-headedness and headaches.       Has memory loss.  New right sided hemiparesis.  Psychiatric/Behavioral: Positive for confusion. Negative for suicidal ideas and hallucinations. The patient is nervous/anxious.        Chronic generalized anxiety syndrome. Some depression related to the loss of her husband over a year ago. She has insomnia. Her mood swings. There are attacks of increased nervousness verging on panic attacks. She feels stressed, fearful, and becomes obsessed with small things that she needs to do. She has difficulty with sequencing events regarding things that she needs to do.       Objective:BP 126/78  Pulse 64    Physical Exam  Constitutional: She is oriented to person, place, and time. She appears well-developed and well-nourished. No distress.  Frail elderly female.  HENT:  Head: Normocephalic and atraumatic.  Nose: Nose normal.  Mouth/Throat: No oropharyngeal exudate.  Chronic hearing loss bilaterally.  Eyes: No scleral icterus.  Neck: Normal range of motion. Neck supple. No JVD present.  No tracheal deviation present. No thyromegaly present.  Cardiovascular: Intact distal pulses.  Exam reveals no gallop and no friction rub.   No murmur heard. Absent popliteal pulses. Fast irregular rate and rhythm.  Pulmonary/Chest: Effort normal and breath sounds normal. No respiratory distress. She has no wheezes. She has no rales. She exhibits no tenderness.  Abdominal: Soft. Bowel sounds are normal. She exhibits no distension and no mass. There is no tenderness.  Musculoskeletal: Normal range of motion. She exhibits no edema and no tenderness.  Unstable on standing. At risk for falls.  Lymphadenopathy:    She has no cervical adenopathy.  Neurological: She is alert and oriented to person, place, and time. No cranial nerve deficit.  Coordination abnormal.  Right hemiparesis. Confused. Repetitious.  Skin: Skin is warm and dry. No rash noted. No erythema. No pallor.  Psychiatric: She has a normal mood and affect. Her behavior is normal. Thought content normal.          Assessment & Plan:  Acute, but ill-defined, cerebrovascular disease: Likely CVA  Plan: MRI brain--no contrast  PT and OT  Hypertension: stable  Hemiparesis affecting dominant side as late effect of cerebrovascular accident  Mild memory disturbance

## 2013-06-28 NOTE — Addendum Note (Signed)
Addended by: Kimber Relic on: 06/28/2013 05:06 PM   Modules accepted: Orders

## 2013-06-28 NOTE — Patient Instructions (Signed)
Continue current medications. 

## 2013-06-29 NOTE — Addendum Note (Signed)
Addended by: Charna Elizabeth on: 06/29/2013 02:35 PM   Modules accepted: Orders

## 2013-07-04 ENCOUNTER — Ambulatory Visit
Admission: RE | Admit: 2013-07-04 | Discharge: 2013-07-04 | Disposition: A | Discharge status: Home / Self Care | Payer: Medicare Other | Source: Ambulatory Visit | Attending: Internal Medicine | Admitting: Internal Medicine

## 2013-07-04 ENCOUNTER — Other Ambulatory Visit: Payer: Self-pay | Admitting: Internal Medicine

## 2013-07-04 ENCOUNTER — Inpatient Hospital Stay
Admission: RE | Admit: 2013-07-04 | Discharge: 2013-07-04 | Disposition: A | Discharge status: Home / Self Care | Payer: Medicare Other | Source: Ambulatory Visit | Attending: Internal Medicine | Admitting: Internal Medicine

## 2013-07-04 DIAGNOSIS — I69359 Hemiplegia and hemiparesis following cerebral infarction affecting unspecified side: Secondary | ICD-10-CM

## 2013-07-04 DIAGNOSIS — I639 Cerebral infarction, unspecified: Secondary | ICD-10-CM

## 2013-07-04 DIAGNOSIS — I6789 Other cerebrovascular disease: Secondary | ICD-10-CM

## 2013-07-05 ENCOUNTER — Non-Acute Institutional Stay: Payer: Medicare Other | Admitting: Internal Medicine

## 2013-07-05 ENCOUNTER — Other Ambulatory Visit: Payer: Self-pay | Admitting: Internal Medicine

## 2013-07-05 ENCOUNTER — Encounter: Payer: Self-pay | Admitting: Internal Medicine

## 2013-07-05 VITALS — BP 124/70 | HR 60 | Ht 63.0 in | Wt 146.0 lb

## 2013-07-05 DIAGNOSIS — I69959 Hemiplegia and hemiparesis following unspecified cerebrovascular disease affecting unspecified side: Secondary | ICD-10-CM

## 2013-07-05 DIAGNOSIS — R413 Other amnesia: Secondary | ICD-10-CM

## 2013-07-05 DIAGNOSIS — I679 Cerebrovascular disease, unspecified: Secondary | ICD-10-CM

## 2013-07-05 DIAGNOSIS — R269 Unspecified abnormalities of gait and mobility: Secondary | ICD-10-CM

## 2013-07-05 DIAGNOSIS — F329 Major depressive disorder, single episode, unspecified: Secondary | ICD-10-CM

## 2013-07-05 DIAGNOSIS — F3289 Other specified depressive episodes: Secondary | ICD-10-CM

## 2013-07-05 DIAGNOSIS — I69359 Hemiplegia and hemiparesis following cerebral infarction affecting unspecified side: Secondary | ICD-10-CM

## 2013-07-05 DIAGNOSIS — I10 Essential (primary) hypertension: Secondary | ICD-10-CM

## 2013-07-05 DIAGNOSIS — I6789 Other cerebrovascular disease: Secondary | ICD-10-CM

## 2013-07-05 NOTE — Progress Notes (Signed)
Subjective:    Patient ID: [name], female    DOB: 08-28-1923, 79 y.o.   MRN: [id number]  Chief Complaint  Patient presents with  . Medical Managment of Chronic Issues    1 week follow-up for CVA(acute) and memory, here with sister-Elizabeth     HPI Hemiparesis affecting dominant side as late effect of cerebrovascular accident: MRI of the brain on 07/04/13 showed the following: 1. Scattered acute to subacute appearing small left MCA territory infarcts. No associated mass effect or hemorrhage.  2. Underlying advanced chronic small vessel disease and chronic  small left posterior MCA territory infarct. She continues to have right-sided hemiparesis but she has gained some functionality since her last visit.  Mild memory disturbance: Memory is worse than it was 6 months ago. Recent stroke has probably affected her memory as well.  Acute, but ill-defined, cerebrovascular disease: Multiple areas brain scan show evidence of previous infarcts. One of these must be recent, based on the acute right hemiparesis.  Hypertension: Controlled  Abnormality of gait: Secondary to right hemiparesis  Cerebrovascular disease: As noted above  Depression: Patient is highly anxious about having to move to skilled nursing area as opposed to remaining in her home.    Current Outpatient Prescriptions on File Prior to Visit  Medication Sig Dispense Refill  . aspirin 81 MG tablet Take 81 mg by mouth daily.      . Calcium Carbonate-Vitamin D (CALCIUM 600 + D PO) Take 1 tablet by mouth daily.      Marland Kitchen Clopidogrel Bisulfate (PLAVIX PO) Take 75 mg by mouth every morning.       . metoprolol succinate (TOPROL-XL) 50 MG 24 hr tablet Take 50 mg by mouth. Take one tablet daily for blood pressure.  Take with or immediately following a meal.      . mirabegron ER (MYRBETRIQ) 25 MG TB24 tablet Take 25 mg by mouth daily.      . Multiple Vitamin (MULTIVITAMIN) capsule Take 1 capsule by mouth daily.      Marland Kitchen omega-3  acid ethyl esters (LOVAZA) 1 G capsule Take 1 g by mouth daily.       . Red Yeast Rice 600 MG TABS Take 600 mg by mouth daily.       No current facility-administered medications on file prior to visit.    Review of Systems  Constitutional: Positive for fatigue. Negative for fever, chills, diaphoresis, appetite change and unexpected weight change.  HENT: Positive for hearing loss and tinnitus. Negative for ear pain.        Uses hearing aids.  Eyes: Negative.        Had bilateral cataract removal in the last month  Respiratory: Negative.   Cardiovascular: Negative.   Gastrointestinal: Negative.   Genitourinary:       Has some urinary leakage. Nocturia x2-3. Wears a protective pad and daytime and Depends incontinence briefs at night.  Musculoskeletal:       Right knee pain  Skin: Negative.        Dry itchy skin. New lesion on the left forearm that looks like a SCC.  Neurological: Negative for dizziness, speech difficulty, light-headedness and headaches.       Has memory loss.  New right sided hemiparesis.  Psychiatric/Behavioral: Positive for confusion. Negative for suicidal ideas and hallucinations. The patient is nervous/anxious.        Chronic generalized anxiety syndrome. Some depression related to the loss of her husband over a year ago. She has insomnia. Her  mood swings. There are attacks of increased nervousness verging on panic attacks. She feels stressed, fearful, and becomes obsessed with small things that she needs to do. She has difficulty with sequencing events regarding things that she needs to do.       Objective:BP 124/70  Pulse 60  Ht 5\' 3"  (1.6 m)  Wt 146 lb (66.225 kg)  BMI 25.87 kg/m2    Physical Exam  Constitutional: She is oriented to person, place, and time. She appears well-developed and well-nourished. No distress.  Frail elderly female.  HENT:  Head: Normocephalic and atraumatic.  Nose: Nose normal.  Mouth/Throat: No oropharyngeal exudate.  Chronic  hearing loss bilaterally.  Eyes: No scleral icterus.  Neck: Normal range of motion. Neck supple. No JVD present. No tracheal deviation present. No thyromegaly present.  Cardiovascular: Intact distal pulses.  Exam reveals no gallop and no friction rub.   No murmur heard. Absent popliteal pulses. Fast irregular rate and rhythm.  Pulmonary/Chest: Effort normal and breath sounds normal. No respiratory distress. She has no wheezes. She has no rales. She exhibits no tenderness.  Abdominal: Soft. Bowel sounds are normal. She exhibits no distension and no mass. There is no tenderness.  Musculoskeletal: Normal range of motion. She exhibits no edema and no tenderness.  Unstable on standing. At risk for falls.  Lymphadenopathy:    She has no cervical adenopathy.  Neurological: She is alert and oriented to person, place, and time. No cranial nerve deficit. Coordination abnormal.  Right hemiparesis. Confused. Repetitious.  Skin: Skin is warm and dry. No rash noted. No erythema. No pallor.  Psychiatric: She has a normal mood and affect. Her behavior is normal. Thought content normal.          Assessment & Plan:  Hemiparesis affecting dominant side as late effect of cerebrovascular accident: Some improvement since last visit  Mild memory disturbance: Worsening  Acute, but ill-defined, cerebrovascular disease: Recent CVA resulting in right hemiparesis  Hypertension: Controlled  Abnormality of gait: Using a walker  Cerebrovascular disease: Continue clopidogrel  Depression: Continue citalopram

## 2013-07-18 ENCOUNTER — Emergency Department (HOSPITAL_COMMUNITY): Payer: Medicare Other

## 2013-07-18 ENCOUNTER — Emergency Department (HOSPITAL_COMMUNITY)
Admission: EM | Admit: 2013-07-18 | Discharge: 2013-07-18 | Disposition: A | Discharge status: Home / Self Care | Payer: Medicare Other | Attending: Emergency Medicine | Admitting: Emergency Medicine

## 2013-07-18 ENCOUNTER — Encounter (HOSPITAL_COMMUNITY): Payer: Self-pay | Admitting: Emergency Medicine

## 2013-07-18 DIAGNOSIS — Z951 Presence of aortocoronary bypass graft: Secondary | ICD-10-CM | POA: Insufficient documentation

## 2013-07-18 DIAGNOSIS — Z8669 Personal history of other diseases of the nervous system and sense organs: Secondary | ICD-10-CM | POA: Insufficient documentation

## 2013-07-18 DIAGNOSIS — I252 Old myocardial infarction: Secondary | ICD-10-CM | POA: Insufficient documentation

## 2013-07-18 DIAGNOSIS — R0989 Other specified symptoms and signs involving the circulatory and respiratory systems: Secondary | ICD-10-CM | POA: Insufficient documentation

## 2013-07-18 DIAGNOSIS — F329 Major depressive disorder, single episode, unspecified: Secondary | ICD-10-CM | POA: Insufficient documentation

## 2013-07-18 DIAGNOSIS — Z87891 Personal history of nicotine dependence: Secondary | ICD-10-CM | POA: Insufficient documentation

## 2013-07-18 DIAGNOSIS — Z7902 Long term (current) use of antithrombotics/antiplatelets: Secondary | ICD-10-CM | POA: Insufficient documentation

## 2013-07-18 DIAGNOSIS — M159 Polyosteoarthritis, unspecified: Secondary | ICD-10-CM | POA: Insufficient documentation

## 2013-07-18 DIAGNOSIS — R0609 Other forms of dyspnea: Secondary | ICD-10-CM | POA: Insufficient documentation

## 2013-07-18 DIAGNOSIS — Z8639 Personal history of other endocrine, nutritional and metabolic disease: Secondary | ICD-10-CM | POA: Insufficient documentation

## 2013-07-18 DIAGNOSIS — R0600 Dyspnea, unspecified: Secondary | ICD-10-CM

## 2013-07-18 DIAGNOSIS — Z872 Personal history of diseases of the skin and subcutaneous tissue: Secondary | ICD-10-CM | POA: Insufficient documentation

## 2013-07-18 DIAGNOSIS — Z79899 Other long term (current) drug therapy: Secondary | ICD-10-CM | POA: Insufficient documentation

## 2013-07-18 DIAGNOSIS — F411 Generalized anxiety disorder: Secondary | ICD-10-CM | POA: Insufficient documentation

## 2013-07-18 DIAGNOSIS — I10 Essential (primary) hypertension: Secondary | ICD-10-CM | POA: Insufficient documentation

## 2013-07-18 DIAGNOSIS — R4182 Altered mental status, unspecified: Secondary | ICD-10-CM | POA: Insufficient documentation

## 2013-07-18 DIAGNOSIS — Z7982 Long term (current) use of aspirin: Secondary | ICD-10-CM | POA: Insufficient documentation

## 2013-07-18 DIAGNOSIS — Z853 Personal history of malignant neoplasm of breast: Secondary | ICD-10-CM | POA: Insufficient documentation

## 2013-07-18 DIAGNOSIS — I2510 Atherosclerotic heart disease of native coronary artery without angina pectoris: Secondary | ICD-10-CM | POA: Insufficient documentation

## 2013-07-18 DIAGNOSIS — Z8673 Personal history of transient ischemic attack (TIA), and cerebral infarction without residual deficits: Secondary | ICD-10-CM | POA: Insufficient documentation

## 2013-07-18 DIAGNOSIS — F0390 Unspecified dementia without behavioral disturbance: Secondary | ICD-10-CM | POA: Insufficient documentation

## 2013-07-18 DIAGNOSIS — Z862 Personal history of diseases of the blood and blood-forming organs and certain disorders involving the immune mechanism: Secondary | ICD-10-CM | POA: Insufficient documentation

## 2013-07-18 LAB — CBC
HCT: 43.2 % (ref 36.0–46.0)
Hemoglobin: 14.9 g/dL (ref 12.0–15.0)
MCH: 31 pg (ref 26.0–34.0)
MCHC: 34.5 g/dL (ref 30.0–36.0)
RBC: 4.81 MIL/uL (ref 3.87–5.11)

## 2013-07-18 LAB — URINE MICROSCOPIC-ADD ON

## 2013-07-18 LAB — URINALYSIS, ROUTINE W REFLEX MICROSCOPIC
Glucose, UA: NEGATIVE mg/dL
Hgb urine dipstick: NEGATIVE
Specific Gravity, Urine: 1.023 (ref 1.005–1.030)
pH: 8 (ref 5.0–8.0)

## 2013-07-18 LAB — POCT I-STAT, CHEM 8
BUN: 22 mg/dL (ref 6–23)
Calcium, Ion: 1.17 mmol/L (ref 1.13–1.30)
Chloride: 107 mEq/L (ref 96–112)
Glucose, Bld: 98 mg/dL (ref 70–99)
Sodium: 137 mEq/L (ref 135–145)

## 2013-07-18 LAB — TROPONIN I
Troponin I: <0.3 ng/mL (ref <0.30)
Troponin I: <0.3 ng/mL (ref <0.30)

## 2013-07-18 NOTE — ED Notes (Addendum)
Per [facility] pt from [facility]. Per staff pt was restless all night. This a.m. She she became short of breath and feels as if she can't catch her breath and reports dizziness. Sats 98% room air, 12 lead unremarkable,  Pt is hypertensive with hx. She has not taken any home meds today. Pt appears anxious. NAD notes pt is alert and oriented to place but unable to state month and situation.

## 2013-07-18 NOTE — ED Provider Notes (Signed)
CSN: [id number]     Arrival date & time 07/18/13  7829 History   First MD Initiated Contact with Patient 07/18/13 1019     Chief Complaint  Patient presents with  . Shortness of Breath  . Altered Mental Status   (Consider location/radiation/quality/duration/timing/severity/associated sxs/prior Treatment) HPI Level V caveat Dementia history is obtained from patient and from patient's sister who accompanies her. Patient complained of dyspnea earlier this morning. No other complaint. As a result spontaneously without treatment. She is presently asymptomatic she denies pain anywhere denies chest pain denies abdominal pain denies dizziness. Her sister who accompanies her states she is at her baseline mental status. No other complaint. No treatment prior to coming  Past Medical History  Diagnosis Date  . Hypertension   . Dementia   . Depression   . Anxiety   . Carpal tunnel syndrome   . Insomnia, unspecified   . Other malaise and fatigue   . Unspecified urinary incontinence   . Hyperlipidemia     Pt declines medication  . Cancer   . Malignant neoplasm of lower-outer quadrant of female breast 1998    Left  . Mild memory disturbance 2012  . Myocardial infarction 2012  . Senile osteoporosis 2012  . TIA (transient ischemic attack) 2012  . Sensorineural hearing loss, unilateral   . Major depressive disorder, single episode, unspecified 07/2011  . Coronary atherosclerosis of native coronary artery 07/2011  . Seborrheic dermatitis, unspecified 07/2011  . Generalized osteoarthrosis, involving multiple sites 07/2011  . Urinary frequency 07/2011   Past Surgical History  Procedure Laterality Date  . Mastectomy Left 1998  . Coronary artery bypass graft  01/2003  . Knee arthroscopy Right 2006  . Total hip arthroplasty Right 2008  . Wrist fracture surgery Right     with internal fixation  . Cataract extraction Bilateral July 2014   Family History  Problem Relation Age of Onset  . Heart  disease Mother   . Heart disease Father   . Cancer Sister     Breast cancer  . Cancer Daughter     Leukemia  . Cancer Sister     Colon  . Hypothyroidism Daughter    History  Substance Use Topics  . Smoking status: Former Smoker    Quit date: 12/14/1952  . Smokeless tobacco: Never Used  . Alcohol Use: No   OB History   Grav Para Term Preterm Abortions TAB SAB Ect Mult Living                 Review of Systems  Unable to perform ROS: Dementia    Allergies  Caffeine; Cardizem; Crestor; Diazepam; Halcion; Statins; Sulfur; and Zocor  Home Medications   Current Outpatient Rx  Name  Route  Sig  Dispense  Refill  . aspirin 81 MG tablet   Oral   Take 81 mg by mouth daily.         . Calcium Carbonate-Vitamin D (CALCIUM 600 + D PO)   Oral   Take 1 tablet by mouth daily.         . citalopram (CELEXA) 40 MG tablet   Oral   Take 40 mg by mouth daily.         Marland Kitchen Clopidogrel Bisulfate (PLAVIX PO)   Oral   Take 75 mg by mouth every morning.          . metoprolol succinate (TOPROL-XL) 50 MG 24 hr tablet   Oral   Take 50 mg by mouth.  Take one tablet daily for blood pressure.  Take with or immediately following a meal.         . mirabegron ER (MYRBETRIQ) 25 MG TB24 tablet   Oral   Take 25 mg by mouth daily.         . Multiple Vitamin (MULTIVITAMIN) capsule   Oral   Take 1 capsule by mouth daily.         Marland Kitchen omega-3 acid ethyl esters (LOVAZA) 1 G capsule   Oral   Take 1 g by mouth daily.          . Red Yeast Rice 600 MG TABS   Oral   Take 600 mg by mouth daily.          BP 166/95  Pulse 64  Temp(Src) 98 F (36.7 C) (Oral)  Resp 20  SpO2 97% Physical Exam  Nursing note and vitals reviewed. Constitutional: She appears well-developed and well-nourished.  HENT:  Head: Normocephalic and atraumatic.  Eyes: Conjunctivae are normal. Pupils are equal, round, and reactive to light.  Neck: Neck supple. No tracheal deviation present. No thyromegaly  present.  Cardiovascular: Normal rate and regular rhythm.   No murmur heard. Pulmonary/Chest: Effort normal and breath sounds normal.  Abdominal: Soft. Bowel sounds are normal. She exhibits no distension. There is no tenderness.  Musculoskeletal: Normal range of motion. She exhibits no edema and no tenderness.  Neurological: She is alert. Coordination normal.  Skin: Skin is warm and dry. No rash noted.  Psychiatric: She has a normal mood and affect.    ED Course  Procedures (including critical care time) Labs Review Labs Reviewed  CBC  COMPREHENSIVE METABOLIC PANEL  URINALYSIS, ROUTINE W REFLEX MICROSCOPIC   Imaging Review No results found.  EKG Interpretation     Ventricular Rate:  62 PR Interval:  144 QRS Duration: 90 QT Interval:  476 QTC Calculation: 483 R Axis:   -30 Text Interpretation:  Sinus bradycardia Left axis deviation Nonspecific T wave abnormality Since last tracing rate slower           Patient remained asymptomatic throughout her entire emergency department course. At 3:15 PM she is alert ambulatory, no distress  ChestX-ray reviewed by me Results for orders placed during the hospital encounter of 07/18/13  CBC      Result Value Range   WBC 8.2  4.0 - 10.5 K/uL   RBC 4.81  3.87 - 5.11 MIL/uL   Hemoglobin 14.9  12.0 - 15.0 g/dL   HCT 40.9  81.1 - 91.4 %   MCV 89.8  78.0 - 100.0 fL   MCH 31.0  26.0 - 34.0 pg   MCHC 34.5  30.0 - 36.0 g/dL   RDW 78.2  95.6 - 21.3 %   Platelets 178  150 - 400 K/uL  URINALYSIS, ROUTINE W REFLEX MICROSCOPIC      Result Value Range   Color, Urine YELLOW  YELLOW   APPearance CLOUDY (*) CLEAR   Specific Gravity, Urine 1.023  1.005 - 1.030   pH 8.0  5.0 - 8.0   Glucose, UA NEGATIVE  NEGATIVE mg/dL   Hgb urine dipstick NEGATIVE  NEGATIVE   Bilirubin Urine NEGATIVE  NEGATIVE   Ketones, ur NEGATIVE  NEGATIVE mg/dL   Protein, ur NEGATIVE  NEGATIVE mg/dL   Urobilinogen, UA 0.2  0.0 - 1.0 mg/dL   Nitrite NEGATIVE   NEGATIVE   Leukocytes, UA SMALL (*) NEGATIVE  TROPONIN I      Result Value  Range   Troponin I <0.30  <0.30 ng/mL  URINE MICROSCOPIC-ADD ON      Result Value Range   Squamous Epithelial / LPF RARE  RARE   WBC, UA 0-2  <3 WBC/hpf   Bacteria, UA MANY (*) RARE   Urine-Other AMORPHOUS URATES/PHOSPHATES    TROPONIN I      Result Value Range   Troponin I <0.30  <0.30 ng/mL  POCT I-STAT, CHEM 8      Result Value Range   Sodium 137  135 - 145 mEq/L   Potassium 3.6  3.5 - 5.1 mEq/L   Chloride 107  96 - 112 mEq/L   BUN 22  6 - 23 mg/dL   Creatinine, Ser 1.61  0.50 - 1.10 mg/dL   Glucose, Bld 98  70 - 99 mg/dL   Calcium, Ion 0.96  0.45 - 1.30 mmol/L   TCO2 23  0 - 100 mmol/L   Hemoglobin 15.3 (*) 12.0 - 15.0 g/dL   HCT 40.9  81.1 - 91.4 %   Dg Chest 2 View  07/18/2013   CLINICAL DATA:  Shortness of breath. Dyspnea. Hypertension. Previous myocardial infarction. Coronary artery disease.  EXAM: CHEST  2 VIEW  COMPARISON:  01/29/2008  FINDINGS: Mild cardiomegaly remains stable. Right lower lung scarring is unchanged. No evidence of pulmonary infiltrate or edema. No evidence of pleural effusion. No mass or lymphadenopathy identified.  Patient has undergone post prior CABG, left cystectomy, and axillary lymph node dissection. T12 vertebral body compression fracture is seen, which is of indeterminate age radiographically but is new since 2009 exam.  IMPRESSION: Mild cardiomegaly and right basilar scarring. No active lung disease.  T12 vertebral body compression fracture of indeterminate age radiographically.   Electronically Signed   By: Myles Rosenthal M.D.   On: 07/18/2013 11:16   Ct Head Wo Contrast  06/26/2013   CLINICAL DATA:  Altered mental status, right hand abnormal positioning  EXAM: CT HEAD WITHOUT CONTRAST  TECHNIQUE: Contiguous axial images were obtained from the base of the skull through the vertex without intravenous contrast.  COMPARISON:  07/25/2011  FINDINGS: Atrophy with low attenuation in  the deep white matter consistent with chronic small vessel ischemic change. No evidence of vascular territory infarct. No hemorrhage or extra-axial fluid. No skull fracture. Chronic subcentimeter lacunar infarct deep periventricular white matter on the left, stable. Prominent perivascular spaces at the base of the brain bilaterally, stable.  IMPRESSION: Chronic involutional change. No acute findings.   Electronically Signed   By: Esperanza Heir M.D.   On: 06/26/2013 11:58   Mr Brain Wo Contrast  07/04/2013   CLINICAL DATA:  79 year old female with expressive base aphasia, confusion, memory loss. Symptoms x1 week suspicious for CVA. Initial encounter.  EXAM: MRI HEAD WITHOUT CONTRAST  TECHNIQUE: Multiplanar, multisequence MR imaging was performed. No intravenous contrast was administered.  COMPARISON:  Head CT 06/26/2013.  FINDINGS: Scattered small mostly subacute but several acute appearing areas of restricted diffusion in the left MCA territory. Both cortex and white matter are affected. Of note, the left inferior frontal gyrus near Broca's area is affected (series 4, images 35 and 36). No associated mass effect or evidence of acute hemorrhage.  Questionable also punctate subacute restricted diffusion in the central left thalamus. No right hemisphere diffusion restriction. No posterior fossa diffusion restriction.  Major intracranial vascular flow voids are preserved.  Underlying widespread cerebral white matter T2 and FLAIR hyperintensity, in part related to diffusely increased perivascular spaces. Small areas of cortical  and subcortical white matter encephalomalacia in the left parietal lobe. Patchy T2 and FLAIR hyperintensity in the pons. Scattered chronic micro hemorrhages in the brain, mostly the left hemisphere and cerebellum.  No ventriculomegaly. No midline shift, mass effect, or evidence of intracranial mass lesion. Negative pituitary, cervicomedullary junction and visualized cervical spine. Visible  internal auditory structures appear normal. Normal bone marrow signal. Visualized paranasal sinuses and mastoids are clear. Postoperative changes to the globes. *INSERT* scout  IMPRESSION: 1. Scattered acute to subacute appearing small left MCA territory infarcts. No associated mass effect or hemorrhage.  2. Underlying advanced chronic small vessel disease and chronic small left posterior MCA territory infarct.   Electronically Signed   By: Augusto Gamble M.D.   On: 07/04/2013 14:36   Dg Knee Complete 4 Views Left  06/26/2013   CLINICAL DATA:  79 year old female with fall and left knee pain.  EXAM: LEFT KNEE - COMPLETE 4+ VIEW  COMPARISON:  None.  FINDINGS: There is no evidence of acute fracture, subluxation or dislocation.  Mild to moderate tricompartmental degenerative changes are present.  An enchondroma/infarct in the distal left femur is noted. No other focal bony lesions are present.  Heavy vascular calcifications are identified.  A knee effusion is present.  IMPRESSION: Knee effusion without acute bony abnormality.  Mild to moderate tricompartmental degenerative changes.   Electronically Signed   By: Laveda Abbe M.D.   On: 06/26/2013 12:35    MDM  No diagnosis found.  Angina is a remote possibility howerver, sx were brief and self-limiting. Pt remained stable throughout ED course and has no objective evidence of ACS and has experiernced no chest pain. Plan f/u Dr. Chilton Si as needed Dx Dyspnea   Doug Sou, MD 07/18/13 [phone]

## 2013-07-18 NOTE — ED Notes (Signed)
Bed: WA18 Expected date:  Expected time:  Means of arrival:  Comments: SOB 

## 2013-07-18 NOTE — ED Notes (Signed)
Pt transported to xray 

## 2013-07-19 NOTE — Patient Instructions (Signed)
Continue current medications. 

## 2013-07-26 ENCOUNTER — Other Ambulatory Visit: Payer: Self-pay | Admitting: Internal Medicine

## 2013-08-20 ENCOUNTER — Other Ambulatory Visit: Payer: Self-pay | Admitting: Geriatric Medicine

## 2013-08-20 DIAGNOSIS — F411 Generalized anxiety disorder: Secondary | ICD-10-CM

## 2013-08-20 MED ORDER — ALPRAZOLAM 0.25 MG PO TABS: 0.2500 mg | ORAL_TABLET | Freq: Two times a day (BID) | ORAL | Status: DC

## 2013-08-20 NOTE — Progress Notes (Signed)
Patient reportedly having anxious/ agitated behaviors. Patient's daughter Marquita Palms requesting use of anxiolytic to reduce these behaviors; antidepressants have not been effective when tried previously. Xanax has been used with positive results.  Patient has appointment with Dr.Green Monday 08/23/13 in [place] clinic. Will fax/call Rx for alparazolam to [facility]. Hard script will sent as well.

## 2013-08-23 ENCOUNTER — Non-Acute Institutional Stay: Payer: Medicare Other | Admitting: Internal Medicine

## 2013-08-23 ENCOUNTER — Encounter: Payer: Self-pay | Admitting: Internal Medicine

## 2013-08-23 VITALS — BP 124/72 | HR 64 | Wt 141.0 lb

## 2013-08-23 DIAGNOSIS — I69359 Hemiplegia and hemiparesis following cerebral infarction affecting unspecified side: Secondary | ICD-10-CM

## 2013-08-23 DIAGNOSIS — I69959 Hemiplegia and hemiparesis following unspecified cerebrovascular disease affecting unspecified side: Secondary | ICD-10-CM

## 2013-08-23 DIAGNOSIS — R269 Unspecified abnormalities of gait and mobility: Secondary | ICD-10-CM

## 2013-08-23 DIAGNOSIS — R413 Other amnesia: Secondary | ICD-10-CM

## 2013-08-23 DIAGNOSIS — I10 Essential (primary) hypertension: Secondary | ICD-10-CM

## 2013-08-23 DIAGNOSIS — I6789 Other cerebrovascular disease: Secondary | ICD-10-CM

## 2013-08-23 NOTE — Progress Notes (Signed)
Subjective:    Patient ID: [name], female    DOB: 05/15/1923, 79 y.o.   MRN: [id number]  HPI Hypertension; controlled  Acute, but ill-defined, cerebrovascular disease: getting over her stroke. Right hemiparesis is improving. Memory is worse since the stroke.  Hemiparesis affecting dominant side as late effect of cerebrovascular accident; improved strength since her last visit  Abnormality of gait: unstable. Slightly drags the right foot  Insomnia. Sister thinks the medication is too strong. Difficult to arouse at times  Mild memory disturbance: worse since her strole  Some pain in the right knee posteriorly.    Current Outpatient Prescriptions on File Prior to Visit  Medication Sig Dispense Refill  . ALPRAZolam (XANAX) 0.25 MG tablet Take 1 tablet (0.25 mg total) by mouth 2 (two) times daily.  60 tablet  3  . aspirin 81 MG tablet Take 81 mg by mouth daily.      . Calcium Carbonate-Vitamin D (CALCIUM 600 + D PO) Take 1 tablet by mouth daily.      . citalopram (CELEXA) 40 MG tablet Take 40 mg by mouth daily.      Marland Kitchen Clopidogrel Bisulfate (PLAVIX PO) Take 75 mg by mouth every morning.       . metoprolol succinate (TOPROL-XL) 50 MG 24 hr tablet Take 50 mg by mouth. Take one tablet daily for blood pressure.  Take with or immediately following a meal.      . Multiple Vitamin (MULTIVITAMIN) capsule Take 1 capsule by mouth daily.      Marland Kitchen MYRBETRIQ 25 MG TB24 tablet TAKE 1 TABLET ONCE DAILY BY MOUTH TO HELP BLADDER CONTROL.  30 tablet  1  . omega-3 acid ethyl esters (LOVAZA) 1 G capsule Take 1 g by mouth daily.       . Red Yeast Rice 600 MG TABS Take 600 mg by mouth daily.       No current facility-administered medications on file prior to visit.    Review of Systems  Constitutional: Positive for fatigue. Negative for fever, chills, diaphoresis, appetite change and unexpected weight change.  HENT: Positive for hearing loss and tinnitus. Negative for ear pain.        Uses  hearing aids.  Eyes: Negative.        Had bilateral cataract removal in the last month  Respiratory: Negative.   Cardiovascular: Negative.   Gastrointestinal: Negative.   Genitourinary:       Has some urinary leakage. Nocturia x2-3. Wears a protective pad and daytime and Depends incontinence briefs at night.  Musculoskeletal:       Right knee pain  Skin: Negative.        Dry itchy skin. New lesion on the left forearm that looks like a SCC.  Neurological: Negative for dizziness, speech difficulty, light-headedness and headaches.       Has memory loss.  Right sided hemiparesis.  Psychiatric/Behavioral: Positive for confusion. Negative for suicidal ideas and hallucinations. The patient is nervous/anxious.        Chronic generalized anxiety syndrome. Some depression related to the loss of her husband over a year ago. She has insomnia. Her mood swings. There are attacks of increased nervousness verging on panic attacks. She feels stressed, fearful, and becomes obsessed with small things that she needs to do. She has difficulty with sequencing events regarding things that she needs to do.       Objective:BP 124/72  Pulse 64  Wt 141 lb (63.957 kg)    Physical Exam  Constitutional: She is oriented to person, place, and time. She appears well-developed and well-nourished. No distress.  Frail elderly female.  HENT:  Head: Normocephalic and atraumatic.  Nose: Nose normal.  Mouth/Throat: No oropharyngeal exudate.  Chronic hearing loss bilaterally.  Eyes: No scleral icterus.  Neck: Normal range of motion. Neck supple. No JVD present. No tracheal deviation present. No thyromegaly present.  Cardiovascular: Intact distal pulses.  Exam reveals no gallop and no friction rub.   No murmur heard. Absent popliteal pulses. Fast irregular rate and rhythm.  Pulmonary/Chest: Effort normal and breath sounds normal. No respiratory distress. She has no wheezes. She has no rales. She exhibits no tenderness.    Abdominal: Soft. Bowel sounds are normal. She exhibits no distension and no mass. There is no tenderness.  Musculoskeletal: Normal range of motion. She exhibits no edema and no tenderness.  Unstable on standing. At risk for falls.  Lymphadenopathy:    She has no cervical adenopathy.  Neurological: She is alert and oriented to person, place, and time. No cranial nerve deficit. Coordination abnormal.  Right hemiparesis. Confused. Repetitious.  Skin: Skin is warm and dry. No rash noted. No erythema. No pallor.  Psychiatric: She has a normal mood and affect. Her behavior is normal. Thought content normal.          Assessment & Plan:  Hypertension: controlled  Acute, but ill-defined, cerebrovascular disease: recent CVA  Hemiparesis affecting dominant side as late effect of cerebrovascular accident: improved strength since last visit  Abnormality of gait: remains unsteady. Encouraged use of cane or walker  Mild memory disturbance: worseniing

## 2013-08-23 NOTE — Patient Instructions (Signed)
Continue current medication.

## 2013-08-23 NOTE — Progress Notes (Signed)
Failed clock drawing  

## 2013-08-26 ENCOUNTER — Other Ambulatory Visit: Payer: Self-pay

## 2013-08-26 DIAGNOSIS — F411 Generalized anxiety disorder: Secondary | ICD-10-CM

## 2013-08-26 MED ORDER — ALPRAZOLAM 0.25 MG PO TABS: ORAL_TABLET | ORAL | Status: DC

## 2013-09-21 ENCOUNTER — Other Ambulatory Visit: Payer: Self-pay | Admitting: Internal Medicine

## 2013-09-28 ENCOUNTER — Non-Acute Institutional Stay (SKILLED_NURSING_FACILITY): Payer: Medicare Other | Admitting: Geriatric Medicine

## 2013-09-28 DIAGNOSIS — R4182 Altered mental status, unspecified: Secondary | ICD-10-CM

## 2013-09-28 DIAGNOSIS — G92 Toxic encephalopathy: Secondary | ICD-10-CM | POA: Insufficient documentation

## 2013-09-28 DIAGNOSIS — G929 Unspecified toxic encephalopathy: Secondary | ICD-10-CM | POA: Insufficient documentation
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
## 2013-09-30 ENCOUNTER — Non-Acute Institutional Stay (SKILLED_NURSING_FACILITY): Payer: Medicare Other | Admitting: Geriatric Medicine

## 2013-09-30 ENCOUNTER — Encounter: Payer: Self-pay | Admitting: Geriatric Medicine

## 2013-09-30 DIAGNOSIS — R4182 Altered mental status, unspecified: Secondary | ICD-10-CM

## 2013-09-30 DIAGNOSIS — R34 Anuria and oliguria: Secondary | ICD-10-CM

## 2013-09-30 DIAGNOSIS — F22 Delusional disorders: Secondary | ICD-10-CM

## 2013-09-30 HISTORY — DX: Delusional disorders: F22

## 2013-09-30 LAB — CBC AND DIFFERENTIAL
HCT: 42 % (ref 36–46)
HEMOGLOBIN: 14.5 g/dL (ref 12.0–16.0)
Platelets: 190 10*3/uL (ref 150–399)
WBC: 7 10*3/mL

## 2013-09-30 LAB — BASIC METABOLIC PANEL
BUN: 24 mg/dL — AB (ref 4–21)
Creatinine: 0.8 mg/dL (ref 0.5–1.1)
GLUCOSE: 64 mg/dL
Potassium: 3.4 mmol/L (ref 3.4–5.3)
Sodium: 144 mmol/L (ref 137–147)

## 2013-09-30 LAB — HEPATIC FUNCTION PANEL
ALT: 17 U/L (ref 7–35)
AST: 36 U/L — AB (ref 13–35)
Alkaline Phosphatase: 45 U/L (ref 25–125)

## 2013-09-30 NOTE — Assessment & Plan Note (Addendum)
Patient  with baseline depression/ anxiety with worsening agitation tat has progressed to paranoia and aggression. Her though process does not appear to be based in reality. She is a danger to herself and others. Will attempt medication for sedation. Mat require in patient psychiatric care.  Family has been contacted and updated by facility DON. They agree with interventions to keep patient and staff safe. Once patient is sedated will attempt to obtain lab studies including CBS, CMP, TSH , U/A culture.

## 2013-09-30 NOTE — Assessment & Plan Note (Signed)
Appears sedated today, no expressions of paranoia. She is disoriented. He is eating and drinking small amounts

## 2013-09-30 NOTE — Progress Notes (Signed)
Patient ID: [name], female   DOB: [birth year], 80 y.o.   MRN: [id number]  [facility] [phone])  Code Status: Living Will      Contact Information   Name [place] Daughter [phone]  [phone]   Lorraine Lax Daughter [phone]  [phone]      Chief Complaint  Patient presents with  . AMS    paranoia    HPI: This is a 80 y.o. female resident of [place], [place]  Section admitted to the memory care section due to aggression and paranoia.  Last visit: Altered mental status Patient  with baseline depression/ anxiety with worsening agitation tat has progressed to paranoia and aggression. Her though process does not appear to be based in reality. She is a danger to herself and others. Will attempt medication for sedation. Mat require in patient psychiatric care.  Family has been contacted and updated by facility DON. They agree with interventions to keep patient and staff safe. Once patient is sedated will attempt to obtain lab studies including CBS, CMP, TSH , U/A culture.  Since last visit patient has received 3 doses of IM Haldol and 3 doses of p.o. Seroquel. Nursing staff reports she is calmer, more cooperative, is eating and drinking small amounts. Personal caregiver remains in attendance. She has not voided recently; urinalysis has not been completed. Lab studies were drawn yesterday no significant abnormalities.   Allergies  Allergen Reactions  . Caffeine     Unknown noted on face sheet   . Cardizem [Diltiazem Hcl]     Unknown noted on face sheet   . Crestor [Rosuvastatin]     Unknown noted on face sheet   . Diazepam     Unknown noted on face sheet   . Halcion [Triazolam]     Unknown noted on face sheet   . Statins     Unknown noted on face sheet   . Sulfur     Unknown noted on face sheet   . Zocor [Simvastatin]     Unknown noted on face sheet      MEDICATION- reviewed  DATA REVIEWED  Radiologic Exams 07/04/2013  MRI HEAD WITHOUT CONTRAST   COMPARISON:  Head CT 10/11/201  IMPRESSION: 1. Scattered acute to subacute appearing small left MCA territory infarcts. No associated mass effect or hemorrhage.   2. Underlying advanced chronic small vessel disease and chronic small left posterior MCA territory infarct.    CHEST  2 VIEW   COMPARISON:  01/29/2008   FINDINGS: Mild cardiomegaly remains stable. Right lower lung scarring is unchanged. No evidence of pulmonary infiltrate or edema. No evidence of pleural effusion. No mass or lymphadenopathy identified.   Patient has undergone post prior CABG, left cystectomy, and axillary lymph node dissection. T12 vertebral body compression fracture is seen, which is of indeterminate age radiographically but is new since 2009 exam.   IMPRESSION: Mild cardiomegaly and right basilar scarring. No active lung disease.   07/18/2013   CHEST  2 VIEW      COMPARISON:  01/29/2008   IMPRESSION: Mild cardiomegaly and right basilar scarring. No active lung disease.  Cardiovascular Exams:   [facility]   [facility]  Component Value   WBC 8.2   HGB 15.3*   HCT 45.0   PLT 178       GLUCOSE 98   NA 137   K 3.6   CL 107   CREATININE 0.90  BUN 22   [facility] 08/17/2013 U/A - negative for infection  Lab Results- [facility] 09/30/2013  Component Value   WBC 8.2   HGB 15.3*   HCT 45.0   PLT 178       GLUCOSE 64   ALT 17   AST 36   NA 137   K 3.6   CL 107   CREATININE 0.90   BUN 22   eGFR 57.09   albumin 3.6       REVIEW OF SYSTEMS - Limited due to AMS DATA OBTAINED: from patient, ears, caregiver GENERAL: Limited appetite MOUTH/THROAT: No mouth or tooth pain  No sore throat    No difficulty chewing or swallowing RESPIRATORY: Cough present,  No wheezing, SOB CARDIAC: No chest pain  No edema. GI: No abdominal pain  No N/V/D or constipation    GU: No recent urine output   MUSCULOSKELETAL: No joint pain, swelling or stiffness  No back pain  No muscle ache, pain, weakness  Gait is unsteady  No recent falls.  NEUROLOGIC: Change in mental status - see HPI PSYCHIATRIC: Less Anxiety,agitation, no overt paranoia   PHYSICAL EXAM - limited due to AMS Filed Vitals:   09/30/13 1656  BP: 134/62  Pulse: 96  Temp: 95.5 F (35.3 C)  Resp: 20  SpO2: 96%   There is no weight on file to calculate BMI. GENERAL APPEARANCE: No acute distress, appropriately groomed, mildly overweight body habitus. Awake,  pleasant, minimally conversant. SKIN: No diaphoresis HEAD: Normocephalic, atraumatic EYES: Conjunctiva/lids clear. Marland Kitchen  EARS: Poor Hearing (chronic). NOSE: No deformity or discharge. MOUTH/THROAT: Lips w/o lesions.  RESPIRATORY: Breathing is even, unlabored. Coarse crackles on right CARDIOVASCULAR: Heart RRR. No murmur or extra heart sounds   EDEMA: No peripheral edema.  MUSCULOSKELETAL: Moves all extremities with full ROM, strength and tone.   NEUROLOGIC: Not oriented to time, place, is oriented to person. PSYCHIATRIC: Sedate, disoriented, calm  ASSESSMENT/PLAN  Altered mental status Remains disoriented, less agitated. Laboratory studies returned without significant abnormality.  Patient with abnormal lung sounds on the right, caregiver reports she is coughing. Will obtain a chest x-ray  Paranoia Appears sedated today, no expressions of paranoia. She is disoriented. He is eating and drinking small amounts  Decreased urine volume Minimal urine output since admission to the memory care unit. Stop Myrbetriq. May require in and out cath to obtain urine specimen   Follow up: As needed  Chest x-ray today. UA C&S pending  Yotam Rhine T.Abdirahman Chittum, NP-C 09/30/2013

## 2013-09-30 NOTE — Assessment & Plan Note (Addendum)
Remains disoriented, less agitated. Laboratory studies returned without significant abnormality.  Patient with abnormal lung sounds on the right, caregiver reports she is coughing. Will obtain a chest x-ray

## 2013-10-01 DIAGNOSIS — R34 Anuria and oliguria: Secondary | ICD-10-CM | POA: Insufficient documentation

## 2013-10-01 NOTE — Assessment & Plan Note (Signed)
Minimal urine output since admission to the memory care unit. Stop Myrbetriq. May require in and out cath to obtain urine specimen

## 2013-10-03 ENCOUNTER — Non-Acute Institutional Stay (SKILLED_NURSING_FACILITY): Payer: Medicare Other | Admitting: Internal Medicine

## 2013-10-03 DIAGNOSIS — I679 Cerebrovascular disease, unspecified: Secondary | ICD-10-CM

## 2013-10-03 DIAGNOSIS — F22 Delusional disorders: Secondary | ICD-10-CM

## 2013-10-03 DIAGNOSIS — F333 Major depressive disorder, recurrent, severe with psychotic symptoms: Secondary | ICD-10-CM

## 2013-10-03 NOTE — Progress Notes (Signed)
Patient ID: [name], female   DOB: April 05, 1923, 80 y.o.   MRN: [id number] Facility; [facility] care] Chief complaint; admission to the facility from the independent apartments section History; this is a patient to was living in the independent apartments section of the [facility]. Her daughter who is present states that she was doing fairly well with some degree of cognitive impairment. She suffered a dominant hemisphere stroke in October leaving her with worsening cognition and some degree of right-sided weakness. She apparently had 24-hour care since that. Early last week she became acutely agitated threatening her caregivers with no lives in her apartment. She required parenteral haldol and more recently has been on oral Seroquel. She has a long history of depression according to her daughter requiring long term counseling but not psychiatric admission. She has been on long-standing celexa. Daughter reports that the patient's husband died roughly a year ago and she has not done particularly well since that event. Daughter also reports some severe agitated events years ago related to the use of Vicodin. She has not had any prior psychiatric hospitalization.  encounter.   EXAM: MRI HEAD WITHOUT CONTRAST   TECHNIQUE: Multiplanar, multisequence MR imaging was performed. No intravenous contrast was administered.   COMPARISON:  Head CT 06/26/2013.   FINDINGS: Scattered small mostly subacute but several acute appearing areas of restricted diffusion in the left MCA territory. Both cortex and white matter are affected. Of note, the left inferior frontal gyrus near Broca's area is affected (series 4, images 35 and 36). No associated mass effect or evidence of acute hemorrhage.   Questionable also punctate subacute restricted diffusion in the central left thalamus. No right hemisphere diffusion restriction. No posterior fossa diffusion restriction.   Major intracranial  vascular flow voids are preserved.   Underlying widespread cerebral white matter T2 and FLAIR hyperintensity, in part related to diffusely increased perivascular spaces. Small areas of cortical and subcortical white matter encephalomalacia in the left parietal lobe. Patchy T2 and FLAIR hyperintensity in the pons. Scattered chronic micro hemorrhages in the brain, mostly the left hemisphere and cerebellum.   No ventriculomegaly. No midline shift, mass effect, or evidence of intracranial mass lesion. Negative pituitary, cervicomedullary junction and visualized cervical spine. Visible internal auditory structures appear normal. Normal bone marrow signal. Visualized paranasal sinuses and mastoids are clear. Postoperative changes to the globes. *INSERT* scout   IMPRESSION: 1. Scattered acute to subacute appearing small left MCA territory infarcts. No associated mass effect or hemorrhage.   2. Underlying advanced chronic small vessel disease and chronic small left posterior MCA territory infarct.     Electronically Signed   By: Lars Pinks M.D.   On: 07/04/2013 14:36     Past Medical History  Diagnosis Date  . Hypertension   . Dementia   . Depression   . Anxiety   . Carpal tunnel syndrome   . Insomnia, unspecified   . Other malaise and fatigue   . Unspecified urinary incontinence   . Hyperlipidemia     Pt declines medication  . Cancer   . Malignant neoplasm of lower-outer quadrant of female breast 1998    Left  . Mild memory disturbance 2012  . Myocardial infarction 2012  . Senile osteoporosis 2012  . TIA (transient ischemic attack) 2012  . Sensorineural hearing loss, unilateral   . Major depressive disorder, single episode, unspecified 07/2011  . Coronary atherosclerosis of native coronary artery 07/2011  . Seborrheic dermatitis, unspecified 07/2011  . Generalized osteoarthrosis, involving  multiple sites 07/2011  . Urinary frequency 07/2011  . Paranoia 09/30/2013    Past Surgical History  Procedure Laterality Date  . Mastectomy Left 1998  . Coronary artery bypass graft  01/2003  . Knee arthroscopy Right 2006  . Total hip arthroplasty Right 2008  . Wrist fracture surgery Right     with internal fixation  . Cataract extraction Bilateral July 2014   Current medications; ASA 81 daily, calcium with vitamin D 3 600/400 once a morning, Celexa 40 mg a day, Centrum Silver one tablet orally, platelets 75 mg daily, low vasa 1 g orally once a morning, but Toprolol 24-hour 50 mg daily, MiraLax 17 g daily, red yeast rice 600 mg once a morning, Seroquel 25 mg at a.m. and pm  Social; I do not see a DO NOT RESUSCITATE on the chart. Widowed approximately a year ago the patient does not handle her spouse's death well i.e. a prolonged grief reaction  family history includes Cancer in her daughter, sister, and sister; Heart disease in her father and mother; Hypothyroidism in her daughter.  Review of systems; not possible from the patient however the patient's daughter reports that the patient is not eating and drinking well. Staff report that medications are consumed through applesauce or  Physical exam; Gen. very depressed air about this patient. Ltd. physical exam largely due to patient's paranoia Vitals; O2 sat is 97% on room air pulse 64 respirations 18 and unlabored  respiratory; clear entry bilaterally Cardiac; heart sounds are normal there is a soft midsystolic murmur which sounds benign. No gallops Abdomen no tenderness no masses GU no suprapubic or costovertebral angle tenderness Neurologic; patient has some increase in tone in the bilateral upper extremity.  Mental status; markedly depressed affect, talks about "suicide". Wants to join her husband. Is also continued significant paranoia There is some obvious cognitive loss although the patient seems aware that she has a cat in her apartment that she is worried about.   Impression/plan #1 Maj. depression  with psychosis. The psychosis element of this is still very evidence. The Seroquel should be gradually increased. Once the paranoia is more under control then the decision will need to be made about her antidepressants. She has been on Celexa for a long period of time would favor switching her to a predominantly norepinephrine blocker ie welllbutrin or cymbalta with taper of the celexa. The paranoid ideation needs to be controlled first #2 background dementia question multi-infarct #3 parkinsonism question vascular vs. Neuroleptic. This would make Seroquel the correct choice of neuroleptic. #4 coronary artery disease I see no evidence that this is active #5 recent history of cerebrovascular disease with a dominant hemisphere stroke. Her apparently cognitive deterioration after this event. #6 if medication compliance is an issue I would suggest simplifying her current regimen including soma multivitamins, red ric, lovaza etc. #7 at extreme risk of protein calorie malnutrition. Nutritional supplements etc

## 2013-10-04 ENCOUNTER — Non-Acute Institutional Stay (SKILLED_NURSING_FACILITY): Payer: Medicare Other | Admitting: Adult Health

## 2013-10-04 ENCOUNTER — Encounter: Payer: Self-pay | Admitting: Adult Health

## 2013-10-04 DIAGNOSIS — F323 Major depressive disorder, single episode, severe with psychotic features: Secondary | ICD-10-CM | POA: Insufficient documentation

## 2013-10-04 DIAGNOSIS — F329 Major depressive disorder, single episode, unspecified: Secondary | ICD-10-CM

## 2013-10-04 DIAGNOSIS — E876 Hypokalemia: Secondary | ICD-10-CM

## 2013-10-04 DIAGNOSIS — R34 Anuria and oliguria: Secondary | ICD-10-CM

## 2013-10-04 NOTE — Assessment & Plan Note (Signed)
Improving. Continue Seroquel 25mg  qam and 50mg  at bedtime. Stay on Memory Care unit with 24 hour caregivers. Monitor for recurrent suicidal ideation, and physical threats

## 2013-10-04 NOTE — Assessment & Plan Note (Signed)
Urine output increased. Monitor intake/output, episodes of incontinence. Will not resume Myrbetriq.

## 2013-10-04 NOTE — Assessment & Plan Note (Signed)
Potassium 3.4 on 09/30/13. Repeat BMP on Thursday 10/07/13

## 2013-10-04 NOTE — Progress Notes (Signed)
Patient ID: [name], female   DOB: 1922/11/16, 80 y.o.   MRN: [id number]      [facility] [phone])  Code Status: Living Will  Contact Information   Name [place] Daughter [phone]  [phone]   Lorraine Lax Daughter [phone]  [phone]      Chief Complaint  Patient presents with  . follow up depression with psychosis    HPI: This is a 80 y.o. female resident of [place], [place]  Section admitted to the memory care section due to aggression and paranoia.  Last visit: 10/03/13 by Dr. Dellia Nims #1 Maj. depression with psychosis. The psychosis element of this is still very evidence. The Seroquel should be gradually increased. Once the paranoia is more under control then the decision will need to be made about her antidepressants. She has been on Celexa for a long period of time would favor switching her to a predominantly norepinephrine blocker ie welllbutrin or cymbalta with taper of the celexa. The paranoid ideation needs to be controlled first #2 background dementia question multi-infarct #3 parkinsonism question vascular vs. Neuroleptic. This would make Seroquel the correct choice of neuroleptic. #4 coronary artery disease I see no evidence that this is active #5 recent history of cerebrovascular disease with a dominant hemisphere stroke. Her apparently cognitive deterioration after this event. #6 if medication compliance is an issue I would suggest simplifying her current regimen including soma multivitamins, red ric, lovaza etc. #7 at extreme risk of protein calorie malnutrition. Nutritional supplements etc  Today: Patient is alert, but tired. Is calm and affect appropriate for situation. Sitting in wheelchair eating snack independently with caregiver at side. She recalls pleasantly a visit with her daughter and Dr. Dellia Nims yesterday. She reports sleeping well, eating well, and  urinating and moving bowels well, as is verified by caregiver. She is cooperative with exam and questioning. No suicidal ideation or threat to others expressed.     Allergies  Allergen Reactions  . Caffeine     Unknown noted on face sheet   . Cardizem [Diltiazem Hcl]     Unknown noted on face sheet   . Crestor [Rosuvastatin]     Unknown noted on face sheet   . Diazepam     Unknown noted on face sheet   . Halcion [Triazolam]     Unknown noted on face sheet   . Statins     Unknown noted on face sheet   . Sulfur     Unknown noted on face sheet   . Zocor [Simvastatin]     Unknown noted on face sheet     MEDICATION- reviewed  DATA REVIEWED  Radiologic Exams Quality Mobile Xray  09/30/13 CXR Negative for effusion, pne  [facility]   [facility]  [facility] Value   WBC 8.2   HGB 15.3*   HCT 45.0   PLT 178       GLUCOSE 98   NA 137   K 3.6   CL 107   CREATININE 0.90   BUN 22   [facility] 08/17/2013 U/A - negative for infection  Lab Results- [facility] 09/30/2013  Component Value   WBC 8.2   HGB 15.3*   HCT 45.0   PLT 178       GLUCOSE 64   ALT 17   AST 36   NA 137   K 3.4   CL 107   CREATININE 0.90   BUN 22  eGFR 57.09   albumin 3.6       REVIEW OF SYSTEMS - DATA OBTAINED: from patient, caregiver GENERAL: NAD, eating well  MOUTH/THROAT: No mouth or tooth pain  No sore throat    No difficulty chewing or swallowing RESPIRATORY: No wheezing, SOB CARDIAC: No chest pain  No edema. GI: Large BM today. No abdominal pain  No N/V/D  GU: Recent urine output with no incontinence   MUSCULOSKELETAL: No joint pain, swelling or stiffness  No back pain  No muscle ache, pain,  Gait is unsteady, right arm weakness  No recent falls.  NEUROLOGIC: reports feeling sleepy PSYCHIATRIC: Less Anxiety,agitation, no overt paranoia, feels in good spirits   PHYSICAL EXAM - limited due to AMS Filed Vitals:   10/04/13 1241  BP: 146/87  Pulse: 66   Temp: 96.7 F (35.9 C)  Resp: 22  SpO2: 97%   There is no weight on file to calculate BMI. GENERAL APPEARANCE: No acute distress, appropriately groomed, mildly overweight body habitus. Awake,  Pleasant, interactive, upright in chair, pleasant and alert, speech slightly slurred with sedating medications SKIN: No diaphoresis HEAD: Normocephalic, atraumatic EYES: Conjunctiva mild thin clear secretion to left eye .  EARS: Poor Hearing (chronic). NOSE: No deformity or discharge. MOUTH/THROAT: Lips w/o lesions.  RESPIRATORY: Breathing is even, unlabored. Mild wheezes bilat LL, no cough, sputum CARDIOVASCULAR: Heart RRR. No murmur or extra heart sounds   EDEMA: No peripheral edema.  MUSCULOSKELETAL: Moves all extremities, right arm weakness per baseline from previous CVA NEUROLOGIC: is oriented to person and place PSYCHIATRIC: mildly sedated, calm  ASSESSMENT/PLAN  Depression, psychotic Improving. Continue Seroquel $RemoveBeforeDEI'25mg'NRVmHNiahpYaBicq$  qam and $Remov'50mg'ftKxSv$  at bedtime. Stay on Memory Care unit with 24 hour caregivers. Monitor for recurrent suicidal ideation, and physical threats  Decreased urine volume Urine output increased. Monitor intake/output, episodes of incontinence. Will not resume Myrbetriq.  Hypokalemia Potassium 3.4 on 09/30/13. Repeat BMP on Thursday 10/07/13   Follow up: As needed    Kasch Borquez T.Taneya Conkel, NP-C/Stephanie Edwards, NP student 10/04/2013  Patient seen with student, agree with assessment and plan. CTK

## 2013-10-07 LAB — BASIC METABOLIC PANEL
BUN: 21 mg/dL (ref 4–21)
CREATININE: 0.7 mg/dL (ref 0.5–1.1)
GLUCOSE: 75 mg/dL
POTASSIUM: 4.3 mmol/L (ref 3.4–5.3)
SODIUM: 141 mmol/L (ref 137–147)

## 2013-10-11 ENCOUNTER — Other Ambulatory Visit: Payer: Self-pay | Admitting: Geriatric Medicine

## 2013-10-11 ENCOUNTER — Non-Acute Institutional Stay (SKILLED_NURSING_FACILITY): Payer: Medicare Other | Admitting: Geriatric Medicine

## 2013-10-11 ENCOUNTER — Encounter: Payer: Self-pay | Admitting: Geriatric Medicine

## 2013-10-11 DIAGNOSIS — F329 Major depressive disorder, single episode, unspecified: Secondary | ICD-10-CM

## 2013-10-11 DIAGNOSIS — F323 Major depressive disorder, single episode, severe with psychotic features: Secondary | ICD-10-CM

## 2013-10-11 DIAGNOSIS — Z66 Do not resuscitate: Secondary | ICD-10-CM

## 2013-10-11 DIAGNOSIS — E876 Hypokalemia: Secondary | ICD-10-CM

## 2013-10-11 NOTE — Assessment & Plan Note (Addendum)
Patient has resumed intermittent agitated and aggressive behavior, has received IM Ativan with little improvement beyond sedation. Patient continues to appear with psychosis; incoherent verbalization, possible hallucinations. She's not eating and drinking adequately, general condition is failing. Discussed with Dr. Dellia Nims via telephone, he recommends changing antipsychotic medication to Risperdal. Also recommends use of Haldol rather than Ativan for periods of agitation. Discussed this plan with the daughter Gwinda Passe the telephone, she agrees. She understands this is a very difficult situation  feels this has been a progressive problem. Patient has expressed suicidal ideation over the last 6 months. She is in favor of any treatment they can ease her Mom's torment, prefers to avoid aggressive, invasive treatments, prefers treatment to be continued in this facility. Update labs tomorrow

## 2013-10-11 NOTE — Assessment & Plan Note (Signed)
Improved at last measure, update labs tomorrow

## 2013-10-11 NOTE — Progress Notes (Signed)
Patient ID: [name], female   DOB: 11/28/1922, 80 y.o.   MRN: [id number]   [facility] [phone])  Code Status: Living Will, DNR  Contact Information   Name Relation Home Work [place] Daughter [phone]  [phone]   Lorraine Lax Daughter [phone]  [phone]      Chief Complaint  Patient presents with  . Psychosis    Psychotic depression    HPI: This is a 80 y.o. female resident of [place], [place] Section admitted to the Memory Care section 09/28/2013 due to aggression and paranoia.   Last visit:  Depression, psychotic Improving. Continue Seroquel $RemoveBeforeDEI'25mg'ZPYhApfbxUpVGnFQ$  qam and $Remov'50mg'CrSdns$  at bedtime. Stay on Memory Care unit with 24 hour caregivers. Monitor for recurrent suicidal ideation, and physical threats  Decreased urine volume Urine output increased. Monitor intake/output, episodes of incontinence. Will not resume Myrbetriq.  Hypokalemia Potassium 3.4 on 09/30/13. Repeat BMP on Thursday 10/07/13  Since last visit, patient has had episodes of recurrent agitation. She received IM Ativan x2 doses over the weekend.  Patient apparently fluctuates between very sedated and agitated behavior which includes shouting at and hitting caregivers. No other details regarding patient's behavior. Review of facility record shows patient's p.o. intake has been poor with 25% of meals eaten at best. She does accept fluids but less than 1000 mL daily. She is urinating, last bowel movement was 2 days ago, medium, last large BM January 21.     Allergies  Allergen Reactions  . Caffeine     Unknown noted on face sheet   . Cardizem [Diltiazem Hcl]     Unknown noted on face sheet   . Crestor [Rosuvastatin]     Unknown noted on face sheet   . Diazepam     Unknown noted on face sheet   . Halcion [Triazolam]     Unknown noted on face sheet   . Statins     Unknown noted on face sheet   . Sulfur     Unknown noted on face  sheet   . Zocor [Simvastatin]     Unknown noted on face sheet     MEDICATION- reviewed  DATA REVIEWED  Radiologic Exams [facility] Mobile Xray  09/30/13 CXR: No cardiomegaly, pulmonary vascular congestion or effusion. Pleural thickening left lower base. No pneumonitis or pulmonary nodule. Minimal scarring and atelectasis bilateral bases.  [facility]   [facility]  Component Value   WBC 8.2   HGB 15.3*   HCT 45.0   PLT 178       GLUCOSE 98   NA 137   K 3.6   CL 107   CREATININE 0.90   BUN 22   [facility] 08/17/2013 U/A - negative for infection  Lab Results- [facility] 09/30/2013  Component Value   WBC 8.2   HGB 15.3*   HCT 45.0   PLT 178       GLUCOSE 64   ALT 17   AST 36   NA 137   K 3.4   CL 107   CREATININE 0.90   BUN 22   eGFR 57.09   albumin 3.6     Lab Results- [facility] 10/07/2013  Component Value   GLUCOSE 75   NA 141   K 4.3   CREATININE 0.7   BUN 21      REVIEW OF SYSTEMS - DATA OBTAINED: from patient, nurse, medical record, caregiver GENERAL: No fever. Fluctuating mood and behavior. Poor appetite MOUTH/THROAT: No mouth  or tooth pain  No sore throat    No difficulty chewing or swallowing RESPIRATORY: No wheezing, SOB CARDIAC: No chest pain  No edema. GI: Last BM today 1/24, incontinent. No abdominal pain  No N/V/D  GU: No dysuria,  No change in urine volume or character incontinent MUSCULOSKELETAL: No joint pain, swelling or stiffness  No back pain  No muscle ache, pain,  non-ambulatory, self propelling in manual WC  PSYCHIATRIC: Intermittent agitation and aggressive behavior   PHYSICAL EXAM - limited due to AMS Filed Vitals:   10/11/13 1309  BP: 118/71  Pulse: 93  Temp: 97 F (36.1 C)   There is no weight on file to calculate BMI.  GENERAL APPEARANCE: Mod. distress, moaning and calling out, slumped in manual wheelchair appropriately groomed, mildly overweight body habitus.  SKIN: No diaphoresis HEAD:  Normocephalic, atraumatic EYES: Conjunctiva clear EARS: Poor Hearing (chronic). NOSE: No deformity or discharge. MOUTH/THROAT: Lips w/o lesions, dry and cracked, would not allow examination of oral cavity RESPIRATORY: Breathing is even, unlabored. Lung sounds are clear  CARDIOVASCULAR: Heart RRR. No murmur or extra heart sounds   EDEMA: No peripheral edema.  MUSCULOSKELETAL: Moves all extremities, right arm weakness per baseline from previous CVA NEUROLOGIC: Unable to assess orientation, speech is slurred and incoherent PSYCHIATRIC: Patient is calling out with no stimulus, appears to be seeing something and is reaching for things in the air. Was eventually able to focus and answer a few questions, and accept a drink of water.  ASSESSMENT/PLAN  Depression, psychotic Patient has resumed intermittent agitated and aggressive behavior, has received IM Ativan with little improvement beyond sedation. Patient continues to appear with psychosis; incoherent verbalization, possible hallucinations. She's not eating and drinking adequately, general condition is failing. Discussed with Dr. Dellia Nims via telephone, he recommends changing antipsychotic medication to Risperdal. Also recommends use of Haldol rather than Ativan for periods of agitation. Discussed this plan with the daughter Gwinda Passe the telephone, she agrees. She understands this is a very difficult situation  feels this has been a progressive problem. Patient has expressed suicidal ideation over the last 6 months. She is in favor of any treatment they can ease her Mom's torment, prefers to avoid aggressive, invasive treatments, prefers treatment to be continued in this facility. Update labs tomorrow  Hypokalemia Improved at last measure, update labs tomorrow   Follow up: As needed    Kenzy Campoverde T.Infant Zink, NP-C 10/11/2013

## 2013-10-16 ENCOUNTER — Emergency Department (HOSPITAL_COMMUNITY): Payer: Medicare Other

## 2013-10-16 ENCOUNTER — Encounter (HOSPITAL_COMMUNITY): Payer: Self-pay | Admitting: Emergency Medicine

## 2013-10-16 ENCOUNTER — Inpatient Hospital Stay (HOSPITAL_COMMUNITY)
Admission: EM | Admit: 2013-10-16 | Discharge: 2013-11-14 | DRG: 056 | Disposition: E | Payer: Medicare Other | Attending: Internal Medicine | Admitting: Internal Medicine

## 2013-10-16 DIAGNOSIS — R410 Disorientation, unspecified: Secondary | ICD-10-CM

## 2013-10-16 DIAGNOSIS — I2510 Atherosclerotic heart disease of native coronary artery without angina pectoris: Secondary | ICD-10-CM | POA: Diagnosis present

## 2013-10-16 DIAGNOSIS — H905 Unspecified sensorineural hearing loss: Secondary | ICD-10-CM | POA: Diagnosis present

## 2013-10-16 DIAGNOSIS — Z96649 Presence of unspecified artificial hip joint: Secondary | ICD-10-CM

## 2013-10-16 DIAGNOSIS — M810 Age-related osteoporosis without current pathological fracture: Secondary | ICD-10-CM | POA: Diagnosis present

## 2013-10-16 DIAGNOSIS — F03918 Unspecified dementia, unspecified severity, with other behavioral disturbance: Secondary | ICD-10-CM | POA: Diagnosis present

## 2013-10-16 DIAGNOSIS — E870 Hyperosmolality and hypernatremia: Secondary | ICD-10-CM | POA: Diagnosis present

## 2013-10-16 DIAGNOSIS — F329 Major depressive disorder, single episode, unspecified: Secondary | ICD-10-CM | POA: Diagnosis present

## 2013-10-16 DIAGNOSIS — Z87891 Personal history of nicotine dependence: Secondary | ICD-10-CM

## 2013-10-16 DIAGNOSIS — R5383 Other fatigue: Secondary | ICD-10-CM

## 2013-10-16 DIAGNOSIS — M159 Polyosteoarthritis, unspecified: Secondary | ICD-10-CM | POA: Diagnosis present

## 2013-10-16 DIAGNOSIS — Z9849 Cataract extraction status, unspecified eye: Secondary | ICD-10-CM

## 2013-10-16 DIAGNOSIS — Z8673 Personal history of transient ischemic attack (TIA), and cerebral infarction without residual deficits: Secondary | ICD-10-CM

## 2013-10-16 DIAGNOSIS — J9600 Acute respiratory failure, unspecified whether with hypoxia or hypercapnia: Secondary | ICD-10-CM | POA: Diagnosis present

## 2013-10-16 DIAGNOSIS — G210 Malignant neuroleptic syndrome: Secondary | ICD-10-CM

## 2013-10-16 DIAGNOSIS — T43505A Adverse effect of unspecified antipsychotics and neuroleptics, initial encounter: Secondary | ICD-10-CM | POA: Diagnosis present

## 2013-10-16 DIAGNOSIS — E860 Dehydration: Secondary | ICD-10-CM

## 2013-10-16 DIAGNOSIS — G929 Unspecified toxic encephalopathy: Secondary | ICD-10-CM | POA: Diagnosis present

## 2013-10-16 DIAGNOSIS — R5381 Other malaise: Secondary | ICD-10-CM | POA: Diagnosis present

## 2013-10-16 DIAGNOSIS — R451 Restlessness and agitation: Secondary | ICD-10-CM | POA: Diagnosis not present

## 2013-10-16 DIAGNOSIS — Z853 Personal history of malignant neoplasm of breast: Secondary | ICD-10-CM

## 2013-10-16 DIAGNOSIS — Z8249 Family history of ischemic heart disease and other diseases of the circulatory system: Secondary | ICD-10-CM

## 2013-10-16 DIAGNOSIS — G92 Toxic encephalopathy: Secondary | ICD-10-CM | POA: Diagnosis present

## 2013-10-16 DIAGNOSIS — F3289 Other specified depressive episodes: Secondary | ICD-10-CM | POA: Diagnosis present

## 2013-10-16 DIAGNOSIS — R32 Unspecified urinary incontinence: Secondary | ICD-10-CM | POA: Diagnosis present

## 2013-10-16 DIAGNOSIS — Z66 Do not resuscitate: Secondary | ICD-10-CM | POA: Diagnosis present

## 2013-10-16 DIAGNOSIS — R531 Weakness: Secondary | ICD-10-CM

## 2013-10-16 DIAGNOSIS — F411 Generalized anxiety disorder: Secondary | ICD-10-CM | POA: Diagnosis present

## 2013-10-16 DIAGNOSIS — R7989 Other specified abnormal findings of blood chemistry: Secondary | ICD-10-CM | POA: Diagnosis present

## 2013-10-16 DIAGNOSIS — G2119 Other drug induced secondary parkinsonism: Secondary | ICD-10-CM | POA: Diagnosis present

## 2013-10-16 DIAGNOSIS — E876 Hypokalemia: Secondary | ICD-10-CM | POA: Diagnosis present

## 2013-10-16 DIAGNOSIS — J690 Pneumonitis due to inhalation of food and vomit: Secondary | ICD-10-CM | POA: Diagnosis present

## 2013-10-16 DIAGNOSIS — D72829 Elevated white blood cell count, unspecified: Secondary | ICD-10-CM | POA: Diagnosis present

## 2013-10-16 DIAGNOSIS — I498 Other specified cardiac arrhythmias: Secondary | ICD-10-CM | POA: Diagnosis present

## 2013-10-16 DIAGNOSIS — I4891 Unspecified atrial fibrillation: Secondary | ICD-10-CM | POA: Diagnosis present

## 2013-10-16 DIAGNOSIS — Z515 Encounter for palliative care: Secondary | ICD-10-CM

## 2013-10-16 DIAGNOSIS — I252 Old myocardial infarction: Secondary | ICD-10-CM

## 2013-10-16 DIAGNOSIS — R1310 Dysphagia, unspecified: Secondary | ICD-10-CM | POA: Diagnosis present

## 2013-10-16 DIAGNOSIS — F29 Unspecified psychosis not due to a substance or known physiological condition: Secondary | ICD-10-CM

## 2013-10-16 DIAGNOSIS — Z951 Presence of aortocoronary bypass graft: Secondary | ICD-10-CM

## 2013-10-16 DIAGNOSIS — F0391 Unspecified dementia with behavioral disturbance: Secondary | ICD-10-CM | POA: Diagnosis present

## 2013-10-16 DIAGNOSIS — I10 Essential (primary) hypertension: Secondary | ICD-10-CM | POA: Diagnosis present

## 2013-10-16 DIAGNOSIS — G219 Secondary parkinsonism, unspecified: Principal | ICD-10-CM | POA: Diagnosis present

## 2013-10-16 HISTORY — DX: Unspecified atrial fibrillation: I48.91

## 2013-10-16 LAB — COMPREHENSIVE METABOLIC PANEL
ALT: 28 U/L (ref 0–35)
AST: 21 U/L (ref 0–37)
Albumin: 3 g/dL — ABNORMAL LOW (ref 3.5–5.2)
Alkaline Phosphatase: 75 U/L (ref 39–117)
BUN: 39 mg/dL — AB (ref 6–23)
CO2: 23 mEq/L (ref 19–32)
Calcium: 9.8 mg/dL (ref 8.4–10.5)
Chloride: 111 mEq/L (ref 96–112)
Creatinine, Ser: 0.91 mg/dL (ref 0.50–1.10)
GFR calc Af Amer: 63 mL/min — ABNORMAL LOW (ref >90)
GFR calc non Af Amer: 55 mL/min — ABNORMAL LOW (ref >90)
Glucose, Bld: 117 mg/dL — ABNORMAL HIGH (ref 70–99)
POTASSIUM: 4 meq/L (ref 3.7–5.3)
SODIUM: 150 meq/L — AB (ref 137–147)
TOTAL PROTEIN: 7.3 g/dL (ref 6.0–8.3)
Total Bilirubin: 0.5 mg/dL (ref 0.3–1.2)

## 2013-10-16 LAB — URINALYSIS, ROUTINE W REFLEX MICROSCOPIC
GLUCOSE, UA: NEGATIVE mg/dL
Hgb urine dipstick: NEGATIVE
KETONES UR: NEGATIVE mg/dL
LEUKOCYTES UA: NEGATIVE
Nitrite: NEGATIVE
PH: 5.5 (ref 5.0–8.0)
Protein, ur: 30 mg/dL — AB
Specific Gravity, Urine: 1.03 (ref 1.005–1.030)
Urobilinogen, UA: 0.2 mg/dL (ref 0.0–1.0)

## 2013-10-16 LAB — CBC WITH DIFFERENTIAL/PLATELET
BASOS ABS: 0 10*3/uL (ref 0.0–0.1)
Basophils Relative: 0 % (ref 0–1)
EOS ABS: 0 10*3/uL (ref 0.0–0.7)
Eosinophils Relative: 0 % (ref 0–5)
HCT: 46 % (ref 36.0–46.0)
Hemoglobin: 15.3 g/dL — ABNORMAL HIGH (ref 12.0–15.0)
Lymphocytes Relative: 5 % — ABNORMAL LOW (ref 12–46)
Lymphs Abs: 0.7 10*3/uL (ref 0.7–4.0)
MCH: 30.9 pg (ref 26.0–34.0)
MCHC: 33.3 g/dL (ref 30.0–36.0)
MCV: 92.9 fL (ref 78.0–100.0)
Monocytes Absolute: 1.3 10*3/uL — ABNORMAL HIGH (ref 0.1–1.0)
Monocytes Relative: 10 % (ref 3–12)
NEUTROS PCT: 85 % — AB (ref 43–77)
Neutro Abs: 11.3 10*3/uL — ABNORMAL HIGH (ref 1.7–7.7)
PLATELETS: 265 10*3/uL (ref 150–400)
RBC: 4.95 MIL/uL (ref 3.87–5.11)
RDW: 13.7 % (ref 11.5–15.5)
WBC: 13.2 10*3/uL — ABNORMAL HIGH (ref 4.0–10.5)

## 2013-10-16 LAB — MAGNESIUM: Magnesium: 2.1 mg/dL (ref 1.5–2.5)

## 2013-10-16 LAB — LACTATE DEHYDROGENASE: LDH: 215 U/L (ref 94–250)

## 2013-10-16 LAB — IRON AND TIBC
Iron: 28 ug/dL — ABNORMAL LOW (ref 42–135)
Saturation Ratios: 12 % — ABNORMAL LOW (ref 20–55)
TIBC: 234 ug/dL — ABNORMAL LOW (ref 250–470)
UIBC: 206 ug/dL (ref 125–400)

## 2013-10-16 LAB — URINE MICROSCOPIC-ADD ON

## 2013-10-16 LAB — RAPID STREP SCREEN (MED CTR MEBANE ONLY): Streptococcus, Group A Screen (Direct): NEGATIVE

## 2013-10-16 LAB — CK: Total CK: 46 U/L (ref 7–177)

## 2013-10-16 LAB — TROPONIN I: Troponin I: <0.3 ng/mL (ref <0.30)

## 2013-10-16 LAB — MRSA PCR SCREENING: MRSA by PCR: NEGATIVE

## 2013-10-16 MED ORDER — METOPROLOL TARTRATE 1 MG/ML IV SOLN
5.0000 mg | INTRAVENOUS | Status: AC
Start: 2013-10-16 — End: 2013-10-16
  Administered 2013-10-16: 5 mg via INTRAVENOUS

## 2013-10-16 MED ORDER — BENZTROPINE MESYLATE 1 MG/ML IJ SOLN
1.0000 mg | Freq: Two times a day (BID) | INTRAMUSCULAR | Status: DC
  Administered 2013-10-16 – 2013-10-17 (×3): 1 mg via INTRAVENOUS
  Filled 2013-10-16 (×5): qty 1

## 2013-10-16 MED ORDER — ONDANSETRON HCL 4 MG/2ML IJ SOLN: 4.0000 mg | Freq: Three times a day (TID) | INTRAMUSCULAR | Status: DC | PRN

## 2013-10-16 MED ORDER — HYDRALAZINE HCL 20 MG/ML IJ SOLN
10.0000 mg | INTRAMUSCULAR | Status: DC | PRN
  Administered 2013-10-17: 10 mg via INTRAVENOUS
  Filled 2013-10-16: qty 1

## 2013-10-16 MED ORDER — AMIODARONE HCL IN DEXTROSE 360-4.14 MG/200ML-% IV SOLN
60.0000 mg/h | INTRAVENOUS | Status: AC
  Administered 2013-10-16: 60 mg/h via INTRAVENOUS

## 2013-10-16 MED ORDER — AMIODARONE IV BOLUS ONLY 150 MG/100ML
INTRAVENOUS | Status: AC
  Administered 2013-10-16: 150 mg
  Filled 2013-10-16: qty 100

## 2013-10-16 MED ORDER — METOPROLOL TARTRATE 1 MG/ML IV SOLN
INTRAVENOUS | Status: AC
  Filled 2013-10-16: qty 5

## 2013-10-16 MED ORDER — ONDANSETRON HCL 4 MG/2ML IJ SOLN: 4.0000 mg | Freq: Four times a day (QID) | INTRAMUSCULAR | Status: DC | PRN

## 2013-10-16 MED ORDER — SODIUM CHLORIDE 0.9 % IV BOLUS (SEPSIS)
1000.0000 mL | Freq: Once | INTRAVENOUS | Status: AC
  Administered 2013-10-16: 1000 mL via INTRAVENOUS

## 2013-10-16 MED ORDER — SODIUM CHLORIDE 0.9 % IJ SOLN
3.0000 mL | Freq: Two times a day (BID) | INTRAMUSCULAR | Status: DC
  Administered 2013-10-16 – 2013-10-18 (×3): 3 mL via INTRAVENOUS

## 2013-10-16 MED ORDER — METOPROLOL TARTRATE 1 MG/ML IV SOLN
5.0000 mg | INTRAVENOUS | Status: AC
  Administered 2013-10-16: 5 mg via INTRAVENOUS

## 2013-10-16 MED ORDER — METOPROLOL TARTRATE 1 MG/ML IV SOLN
5.0000 mg | INTRAVENOUS | Status: DC
  Administered 2013-10-16 – 2013-10-19 (×16): 5 mg via INTRAVENOUS
  Filled 2013-10-16 (×22): qty 5

## 2013-10-16 MED ORDER — ACETAMINOPHEN 650 MG RE SUPP: 650.0000 mg | Freq: Four times a day (QID) | RECTAL | Status: DC | PRN

## 2013-10-16 MED ORDER — SODIUM CHLORIDE 0.9 % IV SOLN
INTRAVENOUS | Status: DC
  Administered 2013-10-16: 13:00:00 via INTRAVENOUS

## 2013-10-16 MED ORDER — SODIUM CHLORIDE 0.9 % IV SOLN
INTRAVENOUS | Status: AC
  Administered 2013-10-16: 16:00:00 via INTRAVENOUS

## 2013-10-16 MED ORDER — AMIODARONE HCL IN DEXTROSE 360-4.14 MG/200ML-% IV SOLN
INTRAVENOUS | Status: AC
  Filled 2013-10-16: qty 200

## 2013-10-16 MED ORDER — METOPROLOL TARTRATE 1 MG/ML IV SOLN
INTRAVENOUS | Status: AC
  Administered 2013-10-16: 5 mg
  Filled 2013-10-16: qty 5

## 2013-10-16 MED ORDER — BENZTROPINE MESYLATE 1 MG/ML IJ SOLN
1.0000 mg | Freq: Once | INTRAMUSCULAR | Status: AC
  Administered 2013-10-16: 1 mg via INTRAVENOUS
  Filled 2013-10-16: qty 2

## 2013-10-16 MED ORDER — DIPHENHYDRAMINE HCL 50 MG/ML IJ SOLN
25.0000 mg | Freq: Four times a day (QID) | INTRAMUSCULAR | Status: DC | PRN
  Administered 2013-10-16 – 2013-10-19 (×7): 25 mg via INTRAVENOUS
  Filled 2013-10-16 (×6): qty 1

## 2013-10-16 MED ORDER — AMIODARONE HCL IN DEXTROSE 360-4.14 MG/200ML-% IV SOLN
30.0000 mg/h | INTRAVENOUS | Status: DC
Start: 2013-10-16 — End: 2013-10-19
  Administered 2013-10-16 – 2013-10-19 (×6): 30 mg/h via INTRAVENOUS
  Filled 2013-10-16 (×6): qty 200

## 2013-10-16 MED ORDER — ACETAMINOPHEN 325 MG PO TABS: 650.0000 mg | ORAL_TABLET | Freq: Four times a day (QID) | ORAL | Status: DC | PRN

## 2013-10-16 MED ORDER — AMIODARONE LOAD VIA INFUSION
150.0000 mg | Freq: Once | INTRAVENOUS | Status: DC
  Filled 2013-10-16: qty 83.34

## 2013-10-16 MED ORDER — SODIUM CHLORIDE 0.9 % IV SOLN
INTRAVENOUS | Status: DC
  Administered 2013-10-16: 16:00:00 via INTRAVENOUS
  Administered 2013-10-17: 125 mL via INTRAVENOUS

## 2013-10-16 MED ORDER — ONDANSETRON HCL 4 MG PO TABS: 4.0000 mg | ORAL_TABLET | Freq: Four times a day (QID) | ORAL | Status: DC | PRN

## 2013-10-16 MED ORDER — ENOXAPARIN SODIUM 40 MG/0.4ML ~~LOC~~ SOLN
40.0000 mg | SUBCUTANEOUS | Status: DC
Start: 2013-10-16 — End: 2013-10-19
  Administered 2013-10-16 – 2013-10-18 (×3): 40 mg via SUBCUTANEOUS
  Filled 2013-10-16 (×4): qty 0.4

## 2013-10-16 NOTE — ED Notes (Signed)
Bed: KZ99 Expected date: 09/18/2013 Expected time: 9:01 AM Means of arrival: Ambulance Comments: Fever, ?blisters in throat area from memory care unit

## 2013-10-16 NOTE — Progress Notes (Signed)
[place] received from ER at 1600 with HR in the 160's,-170's.  Elink notified of A fib, with RVR,  and Lopressor 5 mg IV given times three different doses, see MAR.  HR remains in A fib in the 130's.  Dr. Rockne Menghini paged three different times to notify of patient's HR and continue to be in A fib. Notified E link again about patient remaining in A fib.  Continue to monitor closely. [name] AArnold RN

## 2013-10-16 NOTE — ED Notes (Signed)
Pt from [facility] with cough and sore throat. Per EMS, facility reports fever, pt is non-febrile at this time. Pt  Has hx of dementia. Pt in NAD

## 2013-10-16 NOTE — ED Notes (Signed)
Pt from [facility] via [facility], per EMS staff states that pt has had a fever, more lethargic than normal and sore throat. Pt has dementia. Staff also reported to EMS that pt has "sharp decline in mentation" over past few weeks. Pt in NAD

## 2013-10-16 NOTE — Progress Notes (Signed)
[facility] Progress Note Patient Name: [name] DOB: [birth year] MRN: [id number]  Date of Service  10/14/2013   HPI/Events of Note   Persistent tachycardia with greater BP reduction compared to HR reduction. RN unable to reach primary team.   eICU Interventions  Amiodarone load.    Intervention Category Intermediate Interventions: Arrhythmia - evaluation and management  [name] Blasco R. 10/06/2013, 4:57 PM

## 2013-10-16 NOTE — Progress Notes (Signed)
[facility] Progress Note Patient Name: [name] DOB: 17-Aug-1923 MRN: [id number]  Date of Service  Nov 05, 2013   HPI/Events of Note   Tachycardia responding to metoprolol with acceptable change in BP.   eICU Interventions  Repeat metoprolo 5 mg  dose.    Intervention Category Intermediate Interventions: Arrhythmia - evaluation and management  Adolphe Fortunato R. Nov 05, 2013, 4:13 PM

## 2013-10-16 NOTE — ED Provider Notes (Signed)
CSN: [id number]     Arrival date & time 09/21/2013  0902 History   First MD Initiated Contact with Patient 09/30/2013 0913     Chief Complaint  Patient presents with  . Cough  . Sore Throat   (Consider location/radiation/quality/duration/timing/severity/associated sxs/prior Treatment) HPI Comments: 80 yo female, DNR, htn, dementia, CAD, CVA hx presents with cough, decreased po intake and "sore throat" for a few days.  From retirement living, memory care.  Fever at NH, none in ED.  Pt non verbal.  No recent known infection.  No other sxs per sister who helps with hx.  Mentation has worsened the past two weeks.  No new focal deficits.  No current abx or steroids.  Sister says pt has been similar for the past two weeks.   Patient is a 80 y.o. female presenting with cough and pharyngitis. The history is provided by a relative and the EMS personnel.  Cough Sore Throat    Past Medical History  Diagnosis Date  . Hypertension   . Dementia   . Depression   . Anxiety   . Carpal tunnel syndrome   . Insomnia, unspecified   . Other malaise and fatigue   . Unspecified urinary incontinence   . Hyperlipidemia     Pt declines medication  . Cancer   . Malignant neoplasm of lower-outer quadrant of female breast 1998    Left  . Mild memory disturbance 2012  . Myocardial infarction 2012  . Senile osteoporosis 2012  . TIA (transient ischemic attack) 2012  . Sensorineural hearing loss, unilateral   . Major depressive disorder, single episode, unspecified 07/2011  . Coronary atherosclerosis of native coronary artery 07/2011  . Seborrheic dermatitis, unspecified 07/2011  . Generalized osteoarthrosis, involving multiple sites 07/2011  . Urinary frequency 07/2011  . Paranoia 09/30/2013   Past Surgical History  Procedure Laterality Date  . Mastectomy Left 1998  . Coronary artery bypass graft  01/2003  . Knee arthroscopy Right 2006  . Total hip arthroplasty Right 2008  . Wrist fracture surgery Right      with internal fixation  . Cataract extraction Bilateral July 2014   Family History  Problem Relation Age of Onset  . Heart disease Mother   . Heart disease Father   . Cancer Sister     Breast cancer  . Cancer Daughter     Leukemia  . Cancer Sister     Colon  . Hypothyroidism Daughter    History  Substance Use Topics  . Smoking status: Former Smoker    Quit date: 12/14/1952  . Smokeless tobacco: Never Used  . Alcohol Use: No   OB History   Grav Para Term Preterm Abortions TAB SAB Ect Mult Living                 Review of Systems  Unable to perform ROS: Patient nonverbal  Respiratory: Positive for cough.     Allergies  Caffeine; Cardizem; Crestor; Diazepam; Halcion; Statins; Sulfur; and Zocor  Home Medications   Current Outpatient Rx  Name  Route  Sig  Dispense  Refill  . citalopram (CELEXA) 40 MG tablet   Oral   Take 40 mg by mouth daily.         . clopidogrel (PLAVIX) 75 MG tablet   Oral   Take 75 mg by mouth daily with breakfast.         . haloperidol lactate (HALDOL) 5 MG/ML injection   Intramuscular   Inject 2 mg  into the muscle every 6 (six) hours as needed (extreme agitation).          . metoprolol succinate (TOPROL-XL) 50 MG 24 hr tablet   Oral   Take 50 mg by mouth. Take one tablet daily for blood pressure.  Take with or immediately following a meal.         . polyethylene glycol (MIRALAX / GLYCOLAX) packet   Oral   Take 17 g by mouth daily.         . QUEtiapine (SEROQUEL) 25 MG tablet   Oral   Take 25-50 mg by mouth 2 (two) times daily. 25mg  in am  50 mg pm         . risperiDONE (RISPERDAL) 1 MG tablet   Oral   Take 1 mg by mouth 2 (two) times daily.          BP 161/84  Pulse 128  Temp(Src) 97.4 F (36.3 C) (Axillary)  Resp 32  SpO2 96% Physical Exam  Nursing note and vitals reviewed. Constitutional: She appears well-developed. No distress.  HENT:  Head: Normocephalic and atraumatic.  Severe Dry mm, crusting  roof of mouth, no bleeding, No trismus, uvular deviation, unilateral posterior pharyngeal edema or submandibular swelling.   Eyes: Conjunctivae are normal. Pupils are equal, round, and reactive to light.  Neck: Neck supple. No JVD present.  Cardiovascular: Regular rhythm.  Tachycardia present.   Pulmonary/Chest: She has rales (few bases bilateral).  Abdominal: Soft. She exhibits no distension. There is no tenderness. There is no guarding.  Musculoskeletal: She exhibits no edema.  Lymphadenopathy:    She has no cervical adenopathy.  Neurological: She is alert. GCS eye subscore is 4. GCS verbal subscore is 2. GCS motor subscore is 5.  Difficult exam, pt will not follow commands, stares straight ahead, perrl muscles tense bilateral/ stiffness in arms and legs     Skin: Skin is warm. No rash noted.  Psychiatric: Her affect is blunt.  Demented    ED Course  Procedures (including critical care time) Labs Review Labs Reviewed  CBC WITH DIFFERENTIAL - Abnormal; Notable for the following:    WBC 13.2 (*)    Hemoglobin 15.3 (*)    Neutrophils Relative % 85 (*)    Neutro Abs 11.3 (*)    Lymphocytes Relative 5 (*)    Monocytes Absolute 1.3 (*)    All other components within normal limits  COMPREHENSIVE METABOLIC PANEL - Abnormal; Notable for the following:    Sodium 150 (*)    Glucose, Bld 117 (*)    BUN 39 (*)    Albumin 3.0 (*)    GFR calc non Af Amer 55 (*)    GFR calc Af Amer 63 (*)    All other components within normal limits  URINALYSIS, ROUTINE W REFLEX MICROSCOPIC - Abnormal; Notable for the following:    Color, Urine AMBER (*)    APPearance CLOUDY (*)    Bilirubin Urine SMALL (*)    Protein, ur 30 (*)    All other components within normal limits  URINE MICROSCOPIC-ADD ON - Abnormal; Notable for the following:    Squamous Epithelial / LPF FEW (*)    Bacteria, UA FEW (*)    Casts HYALINE CASTS (*)    All other components within normal limits  RAPID STREP SCREEN   CULTURE, GROUP A STREP  MRSA PCR SCREENING  CK  LACTATE DEHYDROGENASE  MAGNESIUM  IRON AND TIBC  CBC  CREATININE, SERUM  Imaging Review Ct Head Wo Contrast  10/15/2013   CLINICAL DATA:  Multiple strokes, aphasia, demented.  EXAM: CT HEAD WITHOUT CONTRAST  TECHNIQUE: Contiguous axial images were obtained from the base of the skull through the vertex without intravenous contrast.  COMPARISON:  MR 07/04/2013 and earlier studies  FINDINGS: Atherosclerotic and physiologic intracranial calcifications. Diffuse parenchymal atrophy. Patchy areas of hypoattenuation in deep and periventricular white matter left worse than right, slightly progressive since previous exam. Negative for acute intracranial hemorrhage, mass lesion, acute infarction, midline shift, or mass-effect. Acute infarct may be inapparent on noncontrast CT. Ventricles and sulci symmetric. Bone windows demonstrate no focal lesion.  IMPRESSION: 1. Negative for bleed or other acute intracranial process. 2. Atrophy and nonspecific white matter changes as before.   Electronically Signed   By: Arne Cleveland M.D.   On: 10/01/2013 10:50   Dg Chest Portable 1 View  10/09/2013   CLINICAL DATA:  Cough, congestion, hypoxia  EXAM: PORTABLE CHEST - 1 VIEW  COMPARISON:  Portable exam 7591 hr compared to 07/18/2013  FINDINGS: Normal heart size post CABG.  Calcified minimally tortuous thoracic aorta.  Pulmonary vascularity normal.  Chronic interstitial prominence little changed from previous exam.  Minimal scarring at right base stable.  No definite acute infiltrate, pleural effusion or pneumothorax.  Post left mastectomy and axillary node dissection.  Diffuse osseous demineralization.  IMPRESSION: Chronic lung changes as above.  No acute abnormalities.   Electronically Signed   By: Lavonia Dana M.D.   On: 09/17/2013 10:03      MDM   1. Dehydration   2. Confusion   3. Azotemia   4. General weakness   5. HTN (hypertension)   6. HTN (hypertension),  malignant   7. Neuroleptic malignant syndrome   8. Toxic encephalopathy   Hypernatremia  Concern for metabolic vs infectious vs recurrent stroke. Clarified DNR, family at bedside.  Dehydrated clinically. Infectious eval, fluid bolus.  CT head no acute findings. Discussed with medicines, Dr Rockne Menghini feels pt may have Serotonin/ NMS, pt admitted to step down.  Pt improved with cogentin.   The patients results and plan were reviewed and discussed with sister.   Any x-rays performed were personally reviewed by myself.   Differential diagnosis were considered with the presenting HPI.     Admission/ observation were discussed with the admitting physician, patient and/or family and they are comfortable with the plan.     Mariea Clonts, MD 10/09/2013 [phone]

## 2013-10-16 NOTE — Progress Notes (Signed)
[facility] Progress Note Patient Name: Manar Smalling DOB: Oct 05, 1922 MRN: [id number]  Date of Service  11-Nov-2013   HPI/Events of Note   SVT  eICU Interventions  Repeat Lopressor 5mg . RN to contact primary care team. PCCM available for support.    Intervention Category Intermediate Interventions: Arrhythmia - evaluation and management  Britteney Ayotte R. 11/11/13, 4:01 PM

## 2013-10-16 NOTE — H&P (Signed)
[facility] History and Physical  [name] EFE:[id number] DOB: 1923/07/10 DOA: 09/23/2013  Referring physician: Dr. Blane Ohara PCP: Kimber Relic, MD   Chief Complaint: Not eating.   History of Present Illness: [name] is an 80 y.o. female with a PMH of depression on SSRI therapy, paranoia, and dementia, non-ambulatory for the past 2-3 weeks, not eating or drinking for the past 3 days (pocketing food in her mouth), worsening moist cough over the past 3 weeks.  There are also reports of fever from her Memory Care Unit, where she was recently admitted.  Prior to this, she was living at home with 24 hour care delivered by nursing assistants.  She had become increasingly difficult to manage at home secondary to agitated and combative behavior with the aides.  When asked if she was recently started on any new medications, the patient's sister indicated that Seroquel and Risperdal were recently started to control combative behavior.  The lack of ability to ambulate seems to correlate with the initiation of psychotropic medications.  The patient is unable to provide any additional history as she only vocalizes incoherently.  Review of Systems: Unable to obtain.  Past Medical History Past Medical History  Diagnosis Date  . Hypertension   . Dementia   . Depression   . Anxiety   . Carpal tunnel syndrome   . Insomnia, unspecified   . Other malaise and fatigue   . Unspecified urinary incontinence   . Hyperlipidemia     Pt declines medication  . Cancer   . Malignant neoplasm of lower-outer quadrant of female breast 1998    Left  . Mild memory disturbance 2012  . Myocardial infarction 2012  . Senile osteoporosis 2012  . TIA (transient ischemic attack) 2012  . Sensorineural hearing loss, unilateral   . Major depressive disorder, single episode, unspecified 07/2011  . Coronary atherosclerosis of native coronary artery 07/2011  . Seborrheic dermatitis, unspecified 07/2011   . Generalized osteoarthrosis, involving multiple sites 07/2011  . Urinary frequency 07/2011  . Paranoia 09/30/2013     Past Surgical History Past Surgical History  Procedure Laterality Date  . Mastectomy Left 1998  . Coronary artery bypass graft  01/2003  . Knee arthroscopy Right 2006  . Total hip arthroplasty Right 2008  . Wrist fracture surgery Right     with internal fixation  . Cataract extraction Bilateral July 2014     Social History: History   Social History  . Marital Status: Married    Spouse Name: N/A    Number of Children: 2  . Years of Education: N/A   Occupational History  . Not on file.   Social History Main Topics  . Smoking status: Former Smoker    Quit date: 12/14/1952  . Smokeless tobacco: Never Used  . Alcohol Use: No  . Drug Use: No  . Sexual Activity: No   Other Topics Concern  . Not on file   Social History Narrative   Widowed 04/2012 after 67 years of marriage. [place] [facility] since 06/2010. Retired [employer] operational officer. Stopped smoking 1954, drinks occasional glass if wine. Has a pet cat (Rachel)t. Daughters live out of town.     Family History:  Family History  Problem Relation Age of Onset  . Heart disease Mother   . Heart disease Father   . Cancer Sister     Breast cancer  . Cancer Daughter     Leukemia  . Cancer Sister  Colon  . Hypothyroidism Daughter     Allergies: Caffeine; Cardizem; Crestor; Diazepam; Halcion; Statins; Sulfur; and Zocor  Meds: Prior to Admission medications   Medication Sig Start Date End Date Taking? Authorizing Provider  citalopram (CELEXA) 40 MG tablet Take 40 mg by mouth daily.   Yes Historical Provider, MD  clopidogrel (PLAVIX) 75 MG tablet Take 75 mg by mouth daily with breakfast.   Yes Historical Provider, MD  haloperidol lactate (HALDOL) 5 MG/ML injection Inject 2 mg into the muscle every 6 (six) hours as needed (extreme agitation).  09/28/13  Yes Claudette Jeri Cos, NP  metoprolol succinate (TOPROL-XL) 50 MG 24 hr tablet Take 50 mg by mouth. Take one tablet daily for blood pressure.  Take with or immediately following a meal.   Yes Historical Provider, MD  polyethylene glycol (MIRALAX / GLYCOLAX) packet Take 17 g by mouth daily.   Yes Historical Provider, MD  QUEtiapine (SEROQUEL) 25 MG tablet Take 25-50 mg by mouth 2 (two) times daily. 25mg  in am  50 mg pm   Yes Historical Provider, MD  risperiDONE (RISPERDAL) 1 MG tablet Take 1 mg by mouth 2 (two) times daily. 10/11/13  Yes Mardene Celeste, NP    Physical Exam: Filed Vitals:   10/03/2013 1004 09/25/2013 1018 10/07/2013 1136 09/25/2013 1251  BP:    186/102  Pulse: 128 130  131  Temp:   99.7 F (37.6 C)   TempSrc:   Rectal   Resp: 35 30  23  SpO2: 100% 96%  97%     Physical Exam: Blood pressure 186/102, pulse 131, temperature 99.7 F (37.6 C), temperature source Rectal, resp. rate 23, SpO2 97.00%. Gen: Tremulous, awake, rigid Head: Normocephalic, atraumatic. Eyes: PERRL, unable to assess EOM, sclerae nonicteric. Mouth: Oropharynx with extremely dry mucous membranes and dry caked secretions noted on hard palate. Neck: Supple, no thyromegaly, no lymphadenopathy, no jugular venous distention. Chest: Lungs decreased but clear. CV: Heart sounds are tachycardic. No murmurs, rubs or gallops. Abdomen: Soft, nontender, nondistended with normal active bowel sounds. Peri-umbilical hernia, reduces easily. Extremities: Extremities are without clubbing, edema or cyanosis. Skin: Warm and dry. Neuro: Alert, only speaks gibberish; Muscular rigidity with cogwheeling. Psych: Mood and affect flat.  Labs on Admission:  Basic Metabolic Panel:  Recent Labs Lab 09/22/2013 0939  NA 150*  K 4.0  CL 111  CO2 23  GLUCOSE 117*  BUN 39*  CREATININE 0.91  CALCIUM 9.8   Liver Function Tests:  Recent Labs Lab 09/17/2013 0939  AST 21  ALT 28  ALKPHOS 75  BILITOT 0.5  PROT 7.3  ALBUMIN 3.0*    CBC:  Recent Labs Lab 10/03/2013 0939  WBC 13.2*  NEUTROABS 11.3*  HGB 15.3*  HCT 46.0  MCV 92.9  PLT 265   Cardiac Enzymes: No results found for this basename: CKTOTAL, CKMB, CKMBINDEX, TROPONINI,  in the last 168 hours  BNP (last 3 results) No results found for this basename: PROBNP,  in the last 8760 hours CBG: No results found for this basename: GLUCAP,  in the last 168 hours  Radiological Exams on Admission: Ct Head Wo Contrast  09/28/2013   CLINICAL DATA:  Multiple strokes, aphasia, demented.  EXAM: CT HEAD WITHOUT CONTRAST  TECHNIQUE: Contiguous axial images were obtained from the base of the skull through the vertex without intravenous contrast.  COMPARISON:  MR 07/04/2013 and earlier studies  FINDINGS: Atherosclerotic and physiologic intracranial calcifications. Diffuse parenchymal atrophy. Patchy areas of hypoattenuation in deep and periventricular  white matter left worse than right, slightly progressive since previous exam. Negative for acute intracranial hemorrhage, mass lesion, acute infarction, midline shift, or mass-effect. Acute infarct may be inapparent on noncontrast CT. Ventricles and sulci symmetric. Bone windows demonstrate no focal lesion.  IMPRESSION: 1. Negative for bleed or other acute intracranial process. 2. Atrophy and nonspecific white matter changes as before.   Electronically Signed   By: Arne Cleveland M.D.   On: 10/30/13 10:50   Dg Chest Portable 1 View  10-30-2013   CLINICAL DATA:  Cough, congestion, hypoxia  EXAM: PORTABLE CHEST - 1 VIEW  COMPARISON:  Portable exam 4010 hr compared to 07/18/2013  FINDINGS: Normal heart size post CABG.  Calcified minimally tortuous thoracic aorta.  Pulmonary vascularity normal.  Chronic interstitial prominence little changed from previous exam.  Minimal scarring at right base stable.  No definite acute infiltrate, pleural effusion or pneumothorax.  Post left mastectomy and axillary node dissection.  Diffuse osseous  demineralization.  IMPRESSION: Chronic lung changes as above.  No acute abnormalities.   Electronically Signed   By: Lavonia Dana M.D.   On: 10-30-2013 10:03    EKG: Ordered and pending.  Assessment/Plan Principal Problem:   Neuroleptic malignant syndrome versus neuroleptic induced Parkinsonism versus serotonin syndrome Admit the patient to SDU and hydrate.  Check LDH, magnesium, CK and iron to help distinguish between the various differentials noted above.  Give Cogentin now.  Treat tachycardia and accelerated HTN with IV metoprolol and PRN hydralazine.   Active Problems:   Accelerated HTN Suggests autonomic instability from NMS.  IV metoprolol and PRN hydralazine ordered.   Tachycardia Check 12 lead EKG and treat with beta blocker.  Suggests autonomic instability from NMS.   R/O dysphagia ST/swallowing evaluation when safely able to take POs.  NPO for now.   Toxic encephalopathy Secondary to the principal problem.  Supportive care as outlined above.   Dementia with behavioral disturbance Avoid neuroleptics and narcotics.  May need Klonopin or Ativan.  May need a Air cabin crew.   Dehydration Hydrate and monitor electrolytes.  Code Status: DNR Family Communication: Sister at bedside, daughter by telephone. Disposition Plan: SNF.  Time spent: 75 minutes.  [place] Marney [facility] Pager [phone]  If 7PM-7AM, please contact night-coverage [email or web address] Password [email or web address] 2013-10-30, 1:17 PM

## 2013-10-17 ENCOUNTER — Inpatient Hospital Stay (HOSPITAL_COMMUNITY): Payer: Medicare Other

## 2013-10-17 ENCOUNTER — Encounter (HOSPITAL_COMMUNITY): Payer: Self-pay | Admitting: Internal Medicine

## 2013-10-17 DIAGNOSIS — T50904A Poisoning by unspecified drugs, medicaments and biological substances, undetermined, initial encounter: Secondary | ICD-10-CM

## 2013-10-17 DIAGNOSIS — I4891 Unspecified atrial fibrillation: Secondary | ICD-10-CM | POA: Diagnosis present

## 2013-10-17 DIAGNOSIS — J9600 Acute respiratory failure, unspecified whether with hypoxia or hypercapnia: Secondary | ICD-10-CM

## 2013-10-17 DIAGNOSIS — R1310 Dysphagia, unspecified: Secondary | ICD-10-CM | POA: Diagnosis present

## 2013-10-17 DIAGNOSIS — G219 Secondary parkinsonism, unspecified: Principal | ICD-10-CM

## 2013-10-17 DIAGNOSIS — E870 Hyperosmolality and hypernatremia: Secondary | ICD-10-CM | POA: Diagnosis present

## 2013-10-17 HISTORY — DX: Unspecified atrial fibrillation: I48.91

## 2013-10-17 LAB — INFLUENZA PANEL BY PCR (TYPE A & B)
H1N1 flu by pcr: NOT DETECTED
INFLAPCR: NEGATIVE
Influenza B By PCR: NEGATIVE

## 2013-10-17 LAB — BLOOD GAS, ARTERIAL
ACID-BASE DEFICIT: 2.1 mmol/L — AB (ref 0.0–2.0)
Acid-base deficit: 3.5 mmol/L — ABNORMAL HIGH (ref 0.0–2.0)
BICARBONATE: 22.8 meq/L (ref 20.0–24.0)
Bicarbonate: 21.1 mEq/L (ref 20.0–24.0)
DELIVERY SYSTEMS: POSITIVE
Drawn by: 317871
Expiratory PAP: 5
FIO2: 0.4 %
Inspiratory PAP: 10
MODE: POSITIVE
O2 Content: 2 L/min
O2 Saturation: 96 %
O2 Saturation: 98.3 %
PATIENT TEMPERATURE: 98.7
PCO2 ART: 48.3 mmHg — AB (ref 35.0–45.0)
PCO2 ART: 33.1 mmHg — AB (ref 35.0–45.0)
PH ART: 7.296 — AB (ref 7.350–7.450)
Patient temperature: 98.6
TCO2: 20.7 mmol/L (ref 0–100)
TCO2: 18.5 mmol/L (ref 0–100)
pH, Arterial: 7.42 (ref 7.350–7.450)
pO2, Arterial: 92.1 mmHg (ref 80.0–100.0)
pO2, Arterial: 119 mmHg — ABNORMAL HIGH (ref 80.0–100.0)

## 2013-10-17 LAB — BASIC METABOLIC PANEL
BUN: 28 mg/dL — AB (ref 6–23)
CALCIUM: 8.7 mg/dL (ref 8.4–10.5)
CO2: 22 mEq/L (ref 19–32)
CREATININE: 0.73 mg/dL (ref 0.50–1.10)
Chloride: 118 mEq/L — ABNORMAL HIGH (ref 96–112)
GFR calc Af Amer: 86 mL/min — ABNORMAL LOW (ref >90)
GFR, EST NON AFRICAN AMERICAN: 74 mL/min — AB (ref >90)
GLUCOSE: 106 mg/dL — AB (ref 70–99)
Potassium: 4 mEq/L (ref 3.7–5.3)
Sodium: 152 mEq/L — ABNORMAL HIGH (ref 137–147)

## 2013-10-17 LAB — TROPONIN I: Troponin I: <0.3 ng/mL (ref <0.30)

## 2013-10-17 LAB — CBC
HCT: 44.2 % (ref 36.0–46.0)
Hemoglobin: 14.3 g/dL (ref 12.0–15.0)
MCH: 30.9 pg (ref 26.0–34.0)
MCHC: 32.4 g/dL (ref 30.0–36.0)
MCV: 95.5 fL (ref 78.0–100.0)
PLATELETS: 282 10*3/uL (ref 150–400)
RBC: 4.63 MIL/uL (ref 3.87–5.11)
RDW: 14 % (ref 11.5–15.5)
WBC: 16.2 10*3/uL — ABNORMAL HIGH (ref 4.0–10.5)

## 2013-10-17 LAB — TSH: TSH: 0.339 u[IU]/mL — AB (ref 0.350–4.500)

## 2013-10-17 MED ORDER — METOPROLOL TARTRATE 1 MG/ML IV SOLN
5.0000 mg | Freq: Once | INTRAVENOUS | Status: AC
  Administered 2013-10-17: 5 mg via INTRAVENOUS
  Filled 2013-10-17: qty 5

## 2013-10-17 MED ORDER — SODIUM CHLORIDE 0.45 % IV SOLN
INTRAVENOUS | Status: DC
Start: 2013-10-17 — End: 2013-10-18
  Administered 2013-10-17 (×2): via INTRAVENOUS
  Administered 2013-10-17: 125 mL via INTRAVENOUS

## 2013-10-17 MED ORDER — CLONIDINE HCL 0.3 MG/24HR TD PTWK
0.3000 mg | MEDICATED_PATCH | TRANSDERMAL | Status: DC
  Administered 2013-10-17: 0.3 mg via TRANSDERMAL
  Filled 2013-10-17: qty 1

## 2013-10-17 MED ORDER — CHLORHEXIDINE GLUCONATE 0.12 % MT SOLN
15.0000 mL | Freq: Two times a day (BID) | OROMUCOSAL | Status: DC
  Administered 2013-10-17 – 2013-10-19 (×5): 15 mL via OROMUCOSAL
  Filled 2013-10-17 (×7): qty 15

## 2013-10-17 MED ORDER — LEVALBUTEROL HCL 0.63 MG/3ML IN NEBU: 0.6300 mg | INHALATION_SOLUTION | Freq: Four times a day (QID) | RESPIRATORY_TRACT | Status: DC | PRN

## 2013-10-17 MED ORDER — BIOTENE DRY MOUTH MT LIQD
15.0000 mL | Freq: Two times a day (BID) | OROMUCOSAL | Status: DC
  Administered 2013-10-17 (×2): 15 mL via OROMUCOSAL

## 2013-10-17 MED ORDER — MORPHINE SULFATE 2 MG/ML IJ SOLN
2.0000 mg | INTRAMUSCULAR | Status: DC | PRN
  Administered 2013-10-17: 2 mg via INTRAVENOUS
  Filled 2013-10-17: qty 1

## 2013-10-17 MED ORDER — CEFEPIME HCL 2 G IJ SOLR
2.0000 g | INTRAMUSCULAR | Status: DC
  Administered 2013-10-18 – 2013-10-19 (×2): 2 g via INTRAVENOUS
  Filled 2013-10-17 (×2): qty 2

## 2013-10-17 MED ORDER — CEFEPIME HCL 2 G IJ SOLR
2.0000 g | Freq: Once | INTRAMUSCULAR | Status: AC
  Administered 2013-10-17: 2 g via INTRAVENOUS
  Filled 2013-10-17: qty 2

## 2013-10-17 MED ORDER — SODIUM CHLORIDE 0.9 % IV SOLN
500.0000 mg | Freq: Two times a day (BID) | INTRAVENOUS | Status: DC
  Administered 2013-10-17 – 2013-10-18 (×3): 500 mg via INTRAVENOUS
  Filled 2013-10-17 (×4): qty 500

## 2013-10-17 MED ORDER — VANCOMYCIN HCL IN DEXTROSE 1-5 GM/200ML-% IV SOLN
1000.0000 mg | Freq: Once | INTRAVENOUS | Status: AC
  Administered 2013-10-17: 1000 mg via INTRAVENOUS
  Filled 2013-10-17: qty 200

## 2013-10-17 NOTE — Progress Notes (Signed)
ANTIBIOTIC CONSULT NOTE - INITIAL  Pharmacy Consult for vancomycin/cefepime Indication: pneumonia  Allergies  Allergen Reactions  . Caffeine     Unknown noted on face sheet   . Cardizem [Diltiazem Hcl]     Unknown noted on face sheet   . Crestor [Rosuvastatin]     Unknown noted on face sheet   . Diazepam     Unknown noted on face sheet   . Halcion [Triazolam]     Unknown noted on face sheet   . Statins     Unknown noted on face sheet   . Sulfur     Unknown noted on face sheet   . Zocor [Simvastatin]     Unknown noted on face sheet     Patient Measurements: Height: 5\' 3"  (160 cm) Weight: 132 lb 4.4 oz (60 kg) IBW/kg (Calculated) : 52.4 Adjusted Body Weight:   Vital Signs: Temp: 98.2 F (36.8 C) (02/01 0400) Temp src: Axillary (02/01 0400) BP: 161/85 mmHg (02/01 0600) Pulse Rate: 85 (02/01 0600) Intake/Output from previous day: 01/31 0701 - 02/01 0700 In: 2561.1 [I.V.:2561.1] Out: -  Intake/Output from this shift:    Labs:  Recent Labs  10/03/2013 0939 10/17/13 0512  WBC 13.2* 16.2*  HGB 15.3* 14.3  PLT 265 282  CREATININE 0.91 0.73   Estimated Creatinine Clearance: 40.2 ml/min (by C-G formula based on Cr of 0.73). No results found for this basename: VANCOTROUGH, Corlis Leak, VANCORANDOM, [place], GENTPEAK, GENTRANDOM, TOBRATROUGH, TOBRAPEAK, TOBRARND, AMIKACINPEAK, AMIKACINTROU, AMIKACIN,  in the last 72 hours   Microbiology: Recent Results (from the past 720 hour(s))  RAPID STREP SCREEN     Status: None   Collection Time    10/12/2013  9:38 AM      Result Value Range Status   Streptococcus, Group A Screen (Direct) NEGATIVE  NEGATIVE Final   Comment: (NOTE)     A Rapid Antigen test may result negative if the antigen level in the     sample is below the detection level of this test. The FDA has not     cleared this test as a stand-alone test therefore the rapid antigen     negative result has reflexed to a Group A Strep culture.  MRSA PCR  SCREENING     Status: None   Collection Time    10/15/2013  3:42 PM      Result Value Range Status   MRSA by PCR NEGATIVE  NEGATIVE Final   Comment:            The GeneXpert MRSA Assay (FDA     approved for NASAL specimens     only), is one component of a     comprehensive MRSA colonization     surveillance program. It is not     intended to diagnose MRSA     infection nor to guide or     monitor treatment for     MRSA infections.    Medical History: Past Medical History  Diagnosis Date  . Hypertension   . Dementia   . Depression   . Anxiety   . Carpal tunnel syndrome   . Insomnia, unspecified   . Other malaise and fatigue   . Unspecified urinary incontinence   . Hyperlipidemia     Pt declines medication  . Cancer   . Malignant neoplasm of lower-outer quadrant of female breast 1998    Left  . Mild memory disturbance 2012  . Myocardial infarction 2012  . Senile osteoporosis 2012  .  TIA (transient ischemic attack) 2012  . Sensorineural hearing loss, unilateral   . Major depressive disorder, single episode, unspecified 07/2011  . Coronary atherosclerosis of native coronary artery 07/2011  . Seborrheic dermatitis, unspecified 07/2011  . Generalized osteoarthrosis, involving multiple sites 07/2011  . Urinary frequency 07/2011  . Paranoia 09/30/2013  . Atrial fibrillation with rapid ventricular response 10/17/2013    Assessment: 89 YOF admitted for not eating or drinking for 3 days, she is also noted to have cough and possible fever at [facility].  No evidence of pneumonia on CXR 1/31. Orders to start vancomycin and cefepime for possible aspiration and HCAP for worsening cough, to repeat CXR today.  2/1 >> vancomycin  >> 2/1 >> cefepime  >>    Tmax: afebrile WBCs: 16.2 Renal: SCr = 0.73 for est CrCl = 22ml/min (C-G), and 55 ml/min (N)  1/31: Rapid Grp A strep scree: negative 1/31: Grp A strep culture: pending 1/31: influenza PCR: pending  Goal of Therapy:  Vancomycin trough  level 15-20 mcg/ml  Plan:   Vancomycin 1gm IV x 1 then 500mg  IV q12h  Check vancomycin trough at steady state if remains on vancomycin >= 3 days  Cdefepime 2gm IV q24h   Doreene Eland, PharmD, BCPS.   Pager: [phone]  10/17/2013,7:38 AM

## 2013-10-17 NOTE — Progress Notes (Signed)
Nasal tracheal suction ordered as needed--went down right nair with return of copious tan secretions. Patient tolerated well. No complications noted.

## 2013-10-17 NOTE — Progress Notes (Signed)
[facility] PROGRESS NOTE   [name] [id number] DOB: 1923/02/23 DOA: 09/20/2013 PCP: Estill Dooms, MD  Brief narrative: [name] is an 80 y.o. female with a PMH of dementia with behavioral disturbance, recently started on neuroleptics including Seroquel and risperidone, who was admitted with altered mental status including talking, eating, or walking. Shortly after admission, the patient went into atrial fibrillation with rapid ventricular response and developed a worsening cough.  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 No evidence of neuroleptic malignant syndrome given normal CK, normal LDH and relatively normal electrolytes. 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 Monitor off neuroleptics.  Continue Cogentin. Active Problems:   Toxic encephalopathy Multifactorial with acute toxic and metabolic factors contributory. Monitor closely in SDU.   Dehydration / hypernatremia Vigorously hydrating with normal saline at 125 cc per hour. BUN/creatinine ratio improving. Still hypernatremic this morning. Switch IV fluids to half-normal saline.   HTN (hypertension), malignant Blood pressure still very high despite IV metoprolol.  Will place on clonidine patch.  Continue PRN hydralazine.   Atrial fibrillation with RVR Her rate now controlled on metoprolol and amiodarone. Troponins negative x3. Check TSH.   DVT Prophylaxis Continue Lovenox.   Dysphasia with possible aspiration pneumonia / acute respiratory failure Patient was noted to have worsening cough and required nasal pharyngeal suctioning overnight. She's not able to protect her airway. Her initial chest x-ray was clear, but she was quite dehydrated and pneumonia may not have shown up. Her white blood cell count is rising. We'll start empiric vancomycin and cefepime to cover healthcare associated pneumonia.   Repeat chest x-ray this morning.  ABG done and shows low pH/hypercarbia.  Code Status: DNR  Family Communication: Both daughters and sister updated by telephone. Disposition Plan: SNF.   IV access:  Peripheral IV  Medical Consultants:  None  Other Consultants:  None  Anti-infectives:  Vancomycin 10/17/13--->  Cefepime 10/17/13--->  HPI/Subjective: [name] is non-verbal, but opens eyes to stimulation.  Does not follow commands.  Objective: Filed Vitals:   10/17/13 0500 10/17/13 0530 10/17/13 0541 10/17/13 0600  BP: 191/79 200/108 170/90 161/85  Pulse: 90 99 81 85  Temp:      TempSrc:      Resp: 32 33 34 32  Height:      Weight:      SpO2: 92% 90% 94% 96%    Intake/Output Summary (Last 24 hours) at 10/17/13 [id number] Last data filed at 10/17/13 0700  Gross per 24 hour  Intake 2561.1 ml  Output      0 ml  Net 2561.1 ml    Exam: Gen:  Opens eyes, non-verbal Cardiovascular:  Tachycardic, regular rate, No M/R/G Respiratory:  Lungs with scattered rhonchi Gastrointestinal:  Abdomen soft, NT/ND, + BS Extremities:  No C/E/C, no mottling MSK: Remains stiff with cogwheeling  Data Reviewed: Basic Metabolic Panel:  Recent Labs Lab 09/24/2013 0939 10/03/2013 1620 10/17/13 0512  NA 150*  --  152*  K 4.0  --  4.0  CL 111  --  118*  CO2 23  --  22  GLUCOSE 117*  --  106*  BUN 39*  --  28*  CREATININE 0.91  --  0.73  CALCIUM 9.8  --  8.7  MG  --  2.1  --    GFR Estimated Creatinine Clearance: 40.2 ml/min (by C-G formula based on Cr of 0.73). Liver Function Tests:  Recent Labs Lab  10/09/2013 0939  AST 21  ALT 28  ALKPHOS 75  BILITOT 0.5  PROT 7.3  ALBUMIN 3.0*   CBC:  Recent Labs Lab 10/13/2013 0939 10/17/13 0512  WBC 13.2* 16.2*  NEUTROABS 11.3*  --   HGB 15.3* 14.3  HCT 46.0 44.2  MCV 92.9 95.5  PLT 265 282   Cardiac Enzymes:  Recent Labs Lab 09/24/2013 1620 10/11/2013 2325 10/17/13 0512  CKTOTAL 46  --   --   TROPONINI <0.30 <0.30 <0.30    Anemia work up  Recent Labs  09/21/2013 1620  TIBC 234*  IRON 28*   Microbiology Recent Results (from the past 240 hour(s))  RAPID STREP SCREEN     Status: None   Collection Time    09/29/2013  9:38 AM      Result Value Range Status   Streptococcus, Group A Screen (Direct) NEGATIVE  NEGATIVE Final   Comment: (NOTE)     A Rapid Antigen test may result negative if the antigen level in the     sample is below the detection level of this test. The FDA has not     cleared this test as a stand-alone test therefore the rapid antigen     negative result has reflexed to a Group A Strep culture.  MRSA PCR SCREENING     Status: None   Collection Time    09/19/2013  3:42 PM      Result Value Range Status   MRSA by PCR NEGATIVE  NEGATIVE Final   Comment:            The GeneXpert MRSA Assay (FDA     approved for NASAL specimens     only), is one component of a     comprehensive MRSA colonization     surveillance program. It is not     intended to diagnose MRSA     infection nor to guide or     monitor treatment for     MRSA infections.     Procedures and Diagnostic Studies: Ct Head Wo Contrast  09/16/2013   CLINICAL DATA:  Multiple strokes, aphasia, demented.  EXAM: CT HEAD WITHOUT CONTRAST  TECHNIQUE: Contiguous axial images were obtained from the base of the skull through the vertex without intravenous contrast.  COMPARISON:  MR 07/04/2013 and earlier studies  FINDINGS: Atherosclerotic and physiologic intracranial calcifications. Diffuse parenchymal atrophy. Patchy areas of hypoattenuation in deep and periventricular white matter left worse than right, slightly progressive since previous exam. Negative for acute intracranial hemorrhage, mass lesion, acute infarction, midline shift, or mass-effect. Acute infarct may be inapparent on noncontrast CT. Ventricles and sulci symmetric. Bone windows demonstrate no focal lesion.  IMPRESSION: 1. Negative for bleed or other acute intracranial process.  2. Atrophy and nonspecific white matter changes as before.   Electronically Signed   By: Arne Cleveland M.D.   On: 10/04/2013 10:50   Dg Chest Portable 1 View  10/05/2013   CLINICAL DATA:  Cough, congestion, hypoxia  EXAM: PORTABLE CHEST - 1 VIEW  COMPARISON:  Portable exam 2694 hr compared to 07/18/2013  FINDINGS: Normal heart size post CABG.  Calcified minimally tortuous thoracic aorta.  Pulmonary vascularity normal.  Chronic interstitial prominence little changed from previous exam.  Minimal scarring at right base stable.  No definite acute infiltrate, pleural effusion or pneumothorax.  Post left mastectomy and axillary node dissection.  Diffuse osseous demineralization.  IMPRESSION: Chronic lung changes as above.  No acute abnormalities.   Electronically Signed  By: Lavonia Dana M.D.   On: November 07, 2013 10:03    Dg Chest Port 1 View  10/17/2013   CLINICAL DATA:  Short of breath.  EXAM: PORTABLE CHEST - 1 VIEW  COMPARISON:  10/16/2008  FINDINGS: Coarse reticular and ill-defined interstitial densities have mildly increased in the lower lungs when compared to the prior exam. No pneumothorax. No convincing pleural effusion. Changes from CABG surgery are stable. Cardiac silhouette is normal in size. Normal mediastinal contour. Dense calcifications noted along the descending thoracic aorta.  Changes from a left mastectomy are stable.  IMPRESSION: Mild increase in lower lung irregular interstitial opacities when compared to the prior exam. This is consistent with patchy areas of infiltrate superimposed on chronic lung changes.  No other change.   Electronically Signed   By: Lajean Manes M.D.   On: 10/17/2013 08:25     Scheduled Meds: . amiodarone  150 mg Intravenous Once  . antiseptic oral rinse  15 mL Mouth Rinse q12n4p  . benztropine mesylate  1 mg Intravenous BID  . chlorhexidine  15 mL Mouth Rinse BID  . enoxaparin (LOVENOX) injection  40 mg Subcutaneous Q24H  . metoprolol  5 mg Intravenous Q4H  .  sodium chloride  3 mL Intravenous Q12H   Continuous Infusions: . sodium chloride 125 mL (10/17/13 0152)  . amiodarone (NEXTERONE PREMIX) 360 mg/200 mL dextrose 30 mg/hr (11-07-2013 2305)    Time spent: 45 minutes with > 50% of time discussing current diagnostic test results, clinical impression and plan of care with the patient's two daughters and sister by telephone.  The patient is critically ill.    LOS: 1 day   [facility] Pager: [phone] Cell: [phone].   *Please note that the hospitalists switch teams on Wednesdays. Please call the flow manager at [phone] if you are having difficulty reaching the hospitalist taking care of this patient as she can update you and provide the most up-to-date pager number of provider caring for the patient. If 8PM-8AM, please contact night-coverage at [email or web address], password [email or web address]  10/17/2013, 7:07 AM    Please note that Dragon dictation was used in the formation of this note, and may have transcription errors.  This note may not have been transcribed as intended, and may not have been fully proof read for transcription errors upon publication.

## 2013-10-17 DEATH — deceased

## 2013-10-18 LAB — BASIC METABOLIC PANEL
BUN: 25 mg/dL — AB (ref 6–23)
CO2: 21 meq/L (ref 19–32)
CREATININE: 0.73 mg/dL (ref 0.50–1.10)
Calcium: 8.6 mg/dL (ref 8.4–10.5)
Chloride: 113 mEq/L — ABNORMAL HIGH (ref 96–112)
GFR calc non Af Amer: 74 mL/min — ABNORMAL LOW (ref >90)
GFR, EST AFRICAN AMERICAN: 86 mL/min — AB (ref >90)
GLUCOSE: 92 mg/dL (ref 70–99)
POTASSIUM: 3.5 meq/L — AB (ref 3.7–5.3)
Sodium: 145 mEq/L (ref 137–147)

## 2013-10-18 LAB — CBC
HEMATOCRIT: 39.3 % (ref 36.0–46.0)
Hemoglobin: 12.7 g/dL (ref 12.0–15.0)
MCH: 30.7 pg (ref 26.0–34.0)
MCHC: 32.3 g/dL (ref 30.0–36.0)
MCV: 94.9 fL (ref 78.0–100.0)
Platelets: 254 10*3/uL (ref 150–400)
RBC: 4.14 MIL/uL (ref 3.87–5.11)
RDW: 14.1 % (ref 11.5–15.5)
WBC: 12 10*3/uL — ABNORMAL HIGH (ref 4.0–10.5)

## 2013-10-18 LAB — CULTURE, GROUP A STREP

## 2013-10-18 LAB — T4, FREE: Free T4: 1.19 ng/dL (ref 0.80–1.80)

## 2013-10-18 MED ORDER — DIAZEPAM 5 MG/ML IJ SOLN
2.0000 mg | Freq: Once | INTRAMUSCULAR | Status: AC
  Administered 2013-10-18: 2 mg via INTRAVENOUS
  Filled 2013-10-18: qty 2

## 2013-10-18 MED ORDER — POTASSIUM CHLORIDE IN NACL 20-0.45 MEQ/L-% IV SOLN
INTRAVENOUS | Status: DC
  Administered 2013-10-18 (×2): via INTRAVENOUS
  Administered 2013-10-19: 100 mL/h via INTRAVENOUS
  Filled 2013-10-18 (×5): qty 1000

## 2013-10-18 MED ORDER — DIAZEPAM 5 MG/ML IJ SOLN
5.0000 mg | Freq: Four times a day (QID) | INTRAMUSCULAR | Status: DC | PRN
  Administered 2013-10-18 – 2013-10-19 (×2): 5 mg via INTRAVENOUS
  Filled 2013-10-18 (×2): qty 2

## 2013-10-18 NOTE — Progress Notes (Signed)
[facility] PROGRESS NOTE   [name] PIR:[id number] DOB: [birth year] DOA: 09/30/2013 PCP: Estill Dooms, MD  Brief narrative: [name] is an 80 y.o. female with a PMH of dementia with behavioral disturbance, recently started on neuroleptics including Seroquel and risperidone, who was admitted with altered mental status including talking, eating, or walking. Shortly after admission, the patient went into atrial fibrillation with rapid ventricular response and developed a worsening cough.  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 No evidence of neuroleptic malignant syndrome given normal CK, normal LDH and relatively normal electrolytes. 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 Monitor off neuroleptics.  D/C Cogentin. Active Problems:   Hypokalemia Add potassium to IV fluids.   Toxic encephalopathy Multifactorial with acute toxic and metabolic factors contributory. Monitor closely in SDU.  Beginning to show signs of improvement.   Dehydration / hypernatremia Vigorously hydrating with normal saline at 125 cc per hour. BUN/creatinine ratio improving. Hypernatremia resolved 10/18/13 with hypotonic IV fluids.   HTN (hypertension), malignant Blood pressure slightly improved with IV metoprolol and a clonidine patch.  Continue PRN hydralazine.   Atrial fibrillation with RVR Her rate now controlled on metoprolol and amiodarone. Troponins negative x3. TSH low at 0.339. Check free T4   DVT Prophylaxis Continue Lovenox.   Dysphasia with possible aspiration pneumonia / acute respiratory failure Initial chest x-ray was clear, followup chest x-ray 10/17/13 showed some basilar infiltrates. WBC improved after empiric vancomycin and cefepime started.  BiPAP started 10/17/13 to address respiratory failure with hypercarbia.  Code Status: DNR  Family Communication: Both daughters and  sister updated at bedside. Disposition Plan: SNF.   IV access:  Peripheral IV  Medical Consultants:  None  Other Consultants:  None  Anti-infectives:  Vancomycin 10/17/13--->  Cefepime 10/17/13--->  HPI/Subjective: [name] is much more awake and alert, attempting to vocalize, following commands.  Objective: Filed Vitals:   10/18/13 0430 10/18/13 0500 10/18/13 0502 10/18/13 0530  BP: 138/73 182/81 182/81 162/69  Pulse: 79 75 78 68  Temp:      TempSrc:      Resp: 22 26 26 27   Height:      Weight:      SpO2: 100% 100% 100% 100%    Intake/Output Summary (Last 24 hours) at 10/18/13 0705 Last data filed at 10/18/13 0520  Gross per 24 hour  Intake 3470.4 ml  Output      0 ml  Net 3470.4 ml    Exam: Gen:  Awakens to voice, follows commands Cardiovascular:  RRR, No M/R/G Respiratory:  Lungs with scattered rhonchi Gastrointestinal:  Abdomen soft, NT/ND, + BS Extremities:  No C/E/C, no mottling MSK: Remains stiff   Data Reviewed: Basic Metabolic Panel:  Recent Labs Lab 09/17/2013 0939 09/30/2013 1620 10/17/13 0512 10/18/13 0356  NA 150*  --  152* 145  K 4.0  --  4.0 3.5*  CL 111  --  118* 113*  CO2 23  --  22 21  GLUCOSE 117*  --  106* 92  BUN 39*  --  28* 25*  CREATININE 0.91  --  0.73 0.73  CALCIUM 9.8  --  8.7 8.6  MG  --  2.1  --   --    GFR Estimated Creatinine Clearance: 43.5 ml/min (by C-G formula based on Cr of 0.73). Liver Function Tests:  Recent Labs Lab 10/13/2013 0939  AST 21  ALT 28  ALKPHOS 75  BILITOT  0.5  PROT 7.3  ALBUMIN 3.0*   CBC:  Recent Labs Lab 09/17/2013 0939 10/17/13 0512 10/18/13 0356  WBC 13.2* 16.2* 12.0*  NEUTROABS 11.3*  --   --   HGB 15.3* 14.3 12.7  HCT 46.0 44.2 39.3  MCV 92.9 95.5 94.9  PLT 265 282 254   Cardiac Enzymes:  Recent Labs Lab 09/22/2013 1620 10/01/2013 2325 10/17/13 0512  CKTOTAL 46  --   --   TROPONINI <0.30 <0.30 <0.30   Anemia work up  Recent Labs  10/10/2013 1620  TIBC 234*   IRON 28*   Microbiology Recent Results (from the past 240 hour(s))  RAPID STREP SCREEN     Status: None   Collection Time    09/30/2013  9:38 AM      Result Value Range Status   Streptococcus, Group A Screen (Direct) NEGATIVE  NEGATIVE Final   Comment: (NOTE)     A Rapid Antigen test may result negative if the antigen level in the     sample is below the detection level of this test. The FDA has not     cleared this test as a stand-alone test therefore the rapid antigen     negative result has reflexed to a Group A Strep culture.  CULTURE, GROUP A STREP     Status: None   Collection Time    10/12/2013  9:38 AM      Result Value Range Status   Specimen Description THROAT   Final   Special Requests NONE   Final   Culture     Final   Value: NO SUSPICIOUS COLONIES, CONTINUING TO HOLD     Performed at [facility]   Report Status PENDING   Incomplete  MRSA PCR SCREENING     Status: None   Collection Time    10/14/2013  3:42 PM      Result Value Range Status   MRSA by PCR NEGATIVE  NEGATIVE Final   Comment:            The GeneXpert MRSA Assay (FDA     approved for NASAL specimens     only), is one component of a     comprehensive MRSA colonization     surveillance program. It is not     intended to diagnose MRSA     infection nor to guide or     monitor treatment for     MRSA infections.     Procedures and Diagnostic Studies: Ct Head Wo Contrast  09/23/2013   CLINICAL DATA:  Multiple strokes, aphasia, demented.  EXAM: CT HEAD WITHOUT CONTRAST  TECHNIQUE: Contiguous axial images were obtained from the base of the skull through the vertex without intravenous contrast.  COMPARISON:  MR 07/04/2013 and earlier studies  FINDINGS: Atherosclerotic and physiologic intracranial calcifications. Diffuse parenchymal atrophy. Patchy areas of hypoattenuation in deep and periventricular white matter left worse than right, slightly progressive since previous exam. Negative for acute  intracranial hemorrhage, mass lesion, acute infarction, midline shift, or mass-effect. Acute infarct may be inapparent on noncontrast CT. Ventricles and sulci symmetric. Bone windows demonstrate no focal lesion.  IMPRESSION: 1. Negative for bleed or other acute intracranial process. 2. Atrophy and nonspecific white matter changes as before.   Electronically Signed   By: Arne Cleveland M.D.   On: 09/25/2013 10:50   Dg Chest Portable 1 View  10/15/2013   CLINICAL DATA:  Cough, congestion, hypoxia  EXAM: PORTABLE CHEST - 1 VIEW  COMPARISON:  Portable exam 0955 hr compared to 07/18/2013  FINDINGS: Normal heart size post CABG.  Calcified minimally tortuous thoracic aorta.  Pulmonary vascularity normal.  Chronic interstitial prominence little changed from previous exam.  Minimal scarring at right base stable.  No definite acute infiltrate, pleural effusion or pneumothorax.  Post left mastectomy and axillary node dissection.  Diffuse osseous demineralization.  IMPRESSION: Chronic lung changes as above.  No acute abnormalities.   Electronically Signed   By: Lavonia Dana M.D.   On: 10/03/2013 10:03    Dg Chest Port 1 View  10/17/2013   CLINICAL DATA:  Short of breath.  EXAM: PORTABLE CHEST - 1 VIEW  COMPARISON:  10/16/2008  FINDINGS: Coarse reticular and ill-defined interstitial densities have mildly increased in the lower lungs when compared to the prior exam. No pneumothorax. No convincing pleural effusion. Changes from CABG surgery are stable. Cardiac silhouette is normal in size. Normal mediastinal contour. Dense calcifications noted along the descending thoracic aorta.  Changes from a left mastectomy are stable.  IMPRESSION: Mild increase in lower lung irregular interstitial opacities when compared to the prior exam. This is consistent with patchy areas of infiltrate superimposed on chronic lung changes.  No other change.   Electronically Signed   By: Lajean Manes M.D.   On: 10/17/2013 08:25     Scheduled  Meds: . amiodarone  150 mg Intravenous Once  . antiseptic oral rinse  15 mL Mouth Rinse q12n4p  . benztropine mesylate  1 mg Intravenous BID  . ceFEPime (MAXIPIME) IV  2 g Intravenous Q24H  . chlorhexidine  15 mL Mouth Rinse BID  . cloNIDine  0.3 mg Transdermal Weekly  . enoxaparin (LOVENOX) injection  40 mg Subcutaneous Q24H  . metoprolol  5 mg Intravenous Q4H  . sodium chloride  3 mL Intravenous Q12H  . vancomycin  500 mg Intravenous Q12H   Continuous Infusions: . sodium chloride 125 mL (10/17/13 2243)  . amiodarone (NEXTERONE PREMIX) 360 mg/200 mL dextrose 30 mg/hr (10/18/13 0520)    Time spent: 45 minutes with > 50% of time discussing current diagnostic test results, clinical impression and plan of care with the patient's two daughters and sister at the bedside.  The patient is critically ill.    LOS: 2 days   [facility] Pager: [phone] Cell: [phone].   *Please note that the hospitalists switch teams on Wednesdays. Please call the flow manager at [phone] if you are having difficulty reaching the hospitalist taking care of this patient as she can update you and provide the most up-to-date pager number of provider caring for the patient. If 8PM-8AM, please contact night-coverage at [email or web address], password [facility]  10/18/2013, 7:05 AM    Please note that Dragon dictation was used in the formation of this note, and may have transcription errors.  This note may not have been transcribed as intended, and may not have been fully proof read for transcription errors upon publication.

## 2013-10-18 NOTE — Progress Notes (Signed)
CARE MANAGEMENT NOTE 10/18/2013  Patient:  [name], [name]   Account Number:  [id number]  Date Initiated:  10/18/2013  Documentation initiated by:  Kiante Petrovich  Subjective/Objective Assessment:   elderly female from local memory care with increased agressive behavior  found to have a.fib required iv amiodarone drip.     Action/Plan:   snf placement   Anticipated DC Date:  10/21/2013   Anticipated DC Plan:  SKILLED NURSING FACILITY  In-house referral  Clinical Social Worker      DC Planning Services  NA      [facility] Choice  NA   Choice offered to / List presented to:  NA   DME arranged  NA      DME agency  NA     [facility] arranged  NA      [place] agency  NA   Status of service:  In process, will continue to follow Medicare Important Message given?  NA - LOS <3 / Initial given by admissions (If response is "NO", the following Medicare IM given date fields will be blank) Date Medicare IM given:   Date Additional Medicare IM given:    Discharge Disposition:    Per UR Regulation:  Reviewed for med. necessity/level of care/duration of stay  If discussed at [place] of Stay Meetings, dates discussed:    Comments:  [id number]/Andres Vest Eldridge Dace, BSN, [place] [phone] Chart Reviewed for discharge and hospital needs. Discharge needs at time of review:  None present will follow for needs. Review of patient progress due on [id number].

## 2013-10-18 NOTE — Progress Notes (Addendum)
Dr. Rockne Menghini notified of Allergy Listing on profile. Pharmacy consulted also. Patient daughters stated that she has took Xanax in the past with no known reaction. Medication (Valium) given per MD. No adverse reactions noted. Medication has helped decrease agitation. The patient is resting. Will continue to monitor.

## 2013-10-19 DIAGNOSIS — R7989 Other specified abnormal findings of blood chemistry: Secondary | ICD-10-CM | POA: Diagnosis present

## 2013-10-19 DIAGNOSIS — R451 Restlessness and agitation: Secondary | ICD-10-CM | POA: Diagnosis not present

## 2013-10-19 DIAGNOSIS — J690 Pneumonitis due to inhalation of food and vomit: Secondary | ICD-10-CM | POA: Diagnosis present

## 2013-10-19 LAB — CBC
HEMATOCRIT: 42 % (ref 36.0–46.0)
HEMOGLOBIN: 13.8 g/dL (ref 12.0–15.0)
MCH: 30.3 pg (ref 26.0–34.0)
MCHC: 32.9 g/dL (ref 30.0–36.0)
MCV: 92.1 fL (ref 78.0–100.0)
Platelets: 237 10*3/uL (ref 150–400)
RBC: 4.56 MIL/uL (ref 3.87–5.11)
RDW: 13.5 % (ref 11.5–15.5)
WBC: 9.3 10*3/uL (ref 4.0–10.5)

## 2013-10-19 LAB — BASIC METABOLIC PANEL
BUN: 21 mg/dL (ref 6–23)
CHLORIDE: 111 meq/L (ref 96–112)
CO2: 19 meq/L (ref 19–32)
CREATININE: 0.68 mg/dL (ref 0.50–1.10)
Calcium: 8.4 mg/dL (ref 8.4–10.5)
GFR calc non Af Amer: 76 mL/min — ABNORMAL LOW (ref >90)
GFR, EST AFRICAN AMERICAN: 88 mL/min — AB (ref >90)
GLUCOSE: 79 mg/dL (ref 70–99)
Potassium: 3.5 mEq/L — ABNORMAL LOW (ref 3.7–5.3)
Sodium: 143 mEq/L (ref 137–147)

## 2013-10-19 MED ORDER — DIAZEPAM 5 MG/ML IJ SOLN
5.0000 mg | INTRAMUSCULAR | Status: DC | PRN
  Administered 2013-10-19: 5 mg via INTRAVENOUS

## 2013-10-19 MED ORDER — DIAZEPAM 5 MG/ML IJ SOLN: 5.0000 mg | Freq: Four times a day (QID) | INTRAMUSCULAR | Status: AC

## 2013-10-19 MED ORDER — CHLORPROMAZINE HCL 25 MG/ML IJ SOLN: 12.5000 mg | Freq: Four times a day (QID) | INTRAMUSCULAR | Status: AC | PRN

## 2013-10-19 MED ORDER — DIAZEPAM 5 MG/ML IJ SOLN
INTRAMUSCULAR | Status: AC
  Filled 2013-10-19: qty 2

## 2013-10-19 MED ORDER — DIAZEPAM 5 MG/ML IJ SOLN: 5.0000 mg | Freq: Four times a day (QID) | INTRAMUSCULAR | Status: DC

## 2013-10-19 MED ORDER — DIAZEPAM 5 MG/ML IJ SOLN: 2.5000 mg | INTRAMUSCULAR | Status: DC | PRN

## 2013-10-19 MED ORDER — DIAZEPAM 5 MG/ML IJ SOLN: 5.0000 mg | INTRAMUSCULAR | Status: AC | PRN

## 2013-10-19 MED ORDER — CHLORHEXIDINE GLUCONATE 0.12 % MT SOLN: 15.0000 mL | Freq: Two times a day (BID) | OROMUCOSAL | Status: AC

## 2013-10-19 MED ORDER — LEVALBUTEROL HCL 0.63 MG/3ML IN NEBU: 0.6300 mg | INHALATION_SOLUTION | RESPIRATORY_TRACT | Status: DC | PRN

## 2013-10-19 MED ORDER — ATROPINE SULFATE 1 % OP SOLN: 2.0000 [drp] | OPHTHALMIC | Status: AC | PRN

## 2013-10-19 MED ORDER — SODIUM CHLORIDE 0.9 % IV SOLN
12.5000 mg | Freq: Four times a day (QID) | INTRAVENOUS | Status: DC | PRN
  Administered 2013-10-19: 12.5 mg via INTRAVENOUS
  Filled 2013-10-19: qty 0.5

## 2013-10-19 MED ORDER — SCOPOLAMINE 1 MG/3DAYS TD PT72
1.0000 | MEDICATED_PATCH | TRANSDERMAL | Status: DC
  Administered 2013-10-19: 1.5 mg via TRANSDERMAL
  Filled 2013-10-19: qty 1

## 2013-10-19 MED ORDER — ATROPINE SULFATE 1 % OP SOLN
2.0000 [drp] | OPHTHALMIC | Status: DC | PRN
  Administered 2013-10-19: 2 [drp] via SUBLINGUAL
  Filled 2013-10-19: qty 2

## 2013-10-19 MED ORDER — MORPHINE SULFATE 10 MG/ML IJ SOLN
1.0000 mg/h | INTRAMUSCULAR | Status: DC
  Administered 2013-10-19: 1 mg/h via INTRAVENOUS
  Filled 2013-10-19: qty 10

## 2013-10-19 MED ORDER — SCOPOLAMINE 1 MG/3DAYS TD PT72: 1.0000 | MEDICATED_PATCH | TRANSDERMAL | Status: AC

## 2013-10-19 MED ORDER — SODIUM CHLORIDE 0.45 % IV SOLN
INTRAVENOUS | Status: DC
  Filled 2013-10-19: qty 1000

## 2013-10-19 MED ORDER — ONDANSETRON HCL 4 MG/2ML IJ SOLN: 4.0000 mg | Freq: Four times a day (QID) | INTRAMUSCULAR | Status: AC | PRN

## 2013-10-19 MED ORDER — LEVALBUTEROL HCL 0.63 MG/3ML IN NEBU: 0.6300 mg | INHALATION_SOLUTION | RESPIRATORY_TRACT | Status: AC | PRN

## 2013-10-19 MED ORDER — MORPHINE SULFATE 10 MG/ML IJ SOLN
1.0000 mg/h | INTRAMUSCULAR | Status: AC
Start: 2013-10-19 — End: ?

## 2013-10-19 MED ORDER — LORAZEPAM 2 MG/ML IJ SOLN
INTRAMUSCULAR | Status: AC
  Filled 2013-10-19: qty 1

## 2013-10-19 MED ORDER — DIAZEPAM 5 MG/ML IJ SOLN
2.0000 mg | Freq: Once | INTRAMUSCULAR | Status: AC
  Administered 2013-10-19: 2 mg via INTRAVENOUS
  Filled 2013-10-19: qty 2

## 2013-10-19 MED ORDER — BIOTENE DRY MOUTH MT LIQD: 15.0000 mL | Freq: Two times a day (BID) | OROMUCOSAL | Status: AC

## 2013-11-14 NOTE — Progress Notes (Signed)
SLP received cancellation of speech/swallow evaluation. Pt now full comfort care.  Please reorder if indicated. Luanna Salk, [facility] SLP [phone]

## 2013-11-14 NOTE — Progress Notes (Signed)
Clinical Social Work Department BRIEF PSYCHOSOCIAL ASSESSMENT 11/07/2013  Patient:  [name], [name]     Account Number:  [id number]     Admit date:  10-28-13  Clinical Social Worker:  Renold Genta  Date/Time:  10/28/2013 11:48 AM  Referred by:  Physician  Date Referred:  11/04/2013 Referred for  Other - See comment   Other Referral:   Admitted from: [facility]   Interview type:  Family Other interview type:   patient's two daughters, Nathen May at bedside    PSYCHOSOCIAL DATA Living Status:  FACILITY Admitted from facility:  [facility] Level of care:  [place] Primary support name:  Silas Flood (daughter) h#: [phone] c#: [phone] Primary support relationship to patient:  CHILD, ADULT Degree of support available:   good    CURRENT CONCERNS Current Concerns  Post-Acute Placement   Other Concerns:    SOCIAL WORK ASSESSMENT / PLAN CSW received referral that patient was admitted from [facility], she had recently moved into the SNF from [place]. CSW reviewed Dr. Delanna Ahmadi note from the [place] and plan for residential hospice placement.   Assessment/plan status:  Information/Referral to [employer] Other assessment/ plan:   Information/referral to community resources:   CSW made referral to [facility], [facility] - awaiting bed offer/availability.    PATIENT'S/FAMILY'S RESPONSE TO PLAN OF CARE: CSW confirmed with patient's daughters at bedside that [facility] is the preferred residential hospice. Daughters informed CSW of patient's quick decline and are concerned about her agitation. Note patient is to be transferred up to [facility] for palliative/comfort care.       Raynaldo Opitz, [facility] Clinical Social Worker cell #: [phone]

## 2013-11-14 NOTE — Discharge Summary (Addendum)
Death Summary      [name] KZL:[id number] DOB: 1923-09-06 DOA: 10-25-2013  PCP: Estill Dooms, MD  Admit date: 2013/10/25 Date of Death: 10-28-13  Final Diagnoses:  Principal Problem:    Aspiration pneumonia with respiratory failure as a consequence of dysphagia/parkinsonism due to drug Active Problems:    Toxic encephalopathy    Dehydration    HTN (hypertension), malignant    Atrial fibrillation with rapid ventricular response    Dysphagia, unspecified(787.20)    Hypernatremia    Acute respiratory failure    Low TSH level    Aspiration pneumonia    Agitation  History of present illness:  [name] is an 80 y.o. female with a PMH of dementia with behavioral disturbance, recently started on neuroleptics including Seroquel and risperidone, who was admitted with altered mental status including talking, eating, or walking.   Hospital Course:  Shortly after admission, the patient went into atrial fibrillation with rapid ventricular response and developed a worsening cough and acute respiratory failure requiring Bipap, secondary to aspiration PNA. Her Parkinsonian symptoms were treated with Cogentin, and her mental status initially improved, but she became agitated and restless. Because of her poor prognosis, palliative care consultation was obtained on 10/28/2013 and the patient was transitioned to full comfort care with withdrawal of antibiotic support. She was transferred to the floor and placed on a morphine drip with when necessary doses of Valium for comfort purposes. The patient expired with her family present.  Time of death: 6:45 pm.  Signed:  RAMA,CHRISTINA  [facility] 10/28/13, 7:06 PM   **Disclaimer: This note was dictated with voice recognition software. Similar sounding words can inadvertently be transcribed and this note may contain transcription errors which may not have been corrected upon publication of note.**

## 2013-11-14 NOTE — Progress Notes (Signed)
This RN assisted [name] with pronouncing time of death at 5:45 pm today. Unable to auscultate heartbeat or palpate pulse. Kirkland Hun RN

## 2013-11-14 NOTE — Progress Notes (Signed)
Report given to [facility]. Patient transferred to Room 1310 at 1455. Patient is resting calm and resting. Patient's daughters and sister is with patient during transfer.

## 2013-11-14 NOTE — Progress Notes (Signed)
RT took Patient off bipap so RN could clean her mouth and to give her a break. Placed a 40% venti mask on pt. Patient is confused but otherwise tolerating well.

## 2013-11-14 NOTE — Progress Notes (Signed)
[facility] PROGRESS NOTE   [name] [id number] DOB: 1923-03-29 DOA: 10/06/2013 PCP: Estill Dooms, MD  Brief narrative: [name] is an 80 y.o. female with a PMH of dementia with behavioral disturbance, recently started on neuroleptics including Seroquel and risperidone, who was admitted with altered mental status including talking, eating, or walking. Shortly after admission, the patient went into atrial fibrillation with rapid ventricular response and developed a worsening cough and acute respiratory failure requiring Bipap, secondary to aspiration PNA.  A palliative care consult has been requested for help with symptom control given her agitation and overall poor prognosis.  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 No evidence of neuroleptic malignant syndrome given normal CK, normal LDH and relatively normal electrolytes. 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 Monitor off neuroleptics. Muscular rigidity improved, patient now restless and agitated. Active Problems:   Hypokalemia Increase amount of potassium added to IV fluids.   Toxic encephalopathy Multifactorial with acute toxic and metabolic factors contributory. Monitor closely in SDU.  Beginning to show signs of improvement, but now developing some agitation which has been treated with Valium when necessary.   Dehydration / hypernatremia Continue IV fluids. BUN/creatinine ratio improving. Hypernatremia resolved 10/18/13 with hypotonic IV fluids.   HTN (hypertension), malignant Blood pressure slightly improved with IV metoprolol and a clonidine patch.  Continue PRN hydralazine.   Atrial fibrillation with RVR / Low TSH Heart rate now controlled on metoprolol and amiodarone. Troponins negative x3. TSH low at 0.339, but free T4 is normal at 1.19. Currently in NSR on tele.   DVT Prophylaxis Continue  Lovenox.   Dysphasia with possible aspiration pneumonia / acute respiratory failure Initial chest x-ray was clear, followup chest x-ray 10/17/13 showed some basilar infiltrates. WBC improved after empiric vancomycin and cefepime started, and leukocytosis resolved by 11-06-2013.  BiPAP started 10/17/13 to address respiratory failure with hypercarbia, which she continues to require to address increased work of breathing.  Currently on nasal cannula oxygen.  ST consult for swallowing evaluation.  Code Status: DNR  Family Communication: Both daughters and sister updated at bedside. Disposition Plan: SNF.   IV access:  Peripheral IV  Medical Consultants:  Palliative care  Other Consultants:  Speech therapy  Anti-infectives:  Vancomycin 10/17/13--->  Cefepime 10/17/13--->  HPI/Subjective: [name] is much more awake and alert, attempting to vocalize, following commands, but now with increased agitation.  She yells out "Get out", but does calm down and attempt to answer questions, although her speech cannot be understood by myself or her family at times.  Objective: Filed Vitals:   11/06/13 0006 November 06, 2013 0200 2013-11-06 0400 11-06-2013 0630  BP: 149/56 146/77 148/68 145/66  Pulse: 75 69 71 56  Temp: 98.3 F (36.8 C)  97.2 F (36.2 C)   TempSrc: Axillary  Axillary   Resp: 26 19 24 24   Height:      Weight:   64.2 kg (141 lb 8.6 oz)   SpO2: 100% 100% 100% 100%    Intake/Output Summary (Last 24 hours) at 11/06/13 0746 Last data filed at 11-06-13 0600  Gross per 24 hour  Intake 2458.7 ml  Output    855 ml  Net 1603.7 ml    Exam: Gen:  Awakens to voice, follows commands, now restless with psychomotor agitation Cardiovascular:  RRR, No M/R/G Respiratory:  Lungs with scattered rhonchi Gastrointestinal:  Abdomen soft, NT/ND, + BS Extremities:  No C/E/C, no mottling MSK:  Remains stiff   Data Reviewed: Basic Metabolic Panel:  Recent Labs Lab 10/12/2013 0939 09/23/2013 1620  10/17/13 0512 10/18/13 0356 2013/10/28 0339  NA 150*  --  152* 145 143  K 4.0  --  4.0 3.5* 3.5*  CL 111  --  118* 113* 111  CO2 23  --  22 21 19   GLUCOSE 117*  --  106* 92 79  BUN 39*  --  28* 25* 21  CREATININE 0.91  --  0.73 0.73 0.68  CALCIUM 9.8  --  8.7 8.6 8.4  MG  --  2.1  --   --   --    GFR Estimated Creatinine Clearance: 43.8 ml/min (by C-G formula based on Cr of 0.68). Liver Function Tests:  Recent Labs Lab 10/12/2013 0939  AST 21  ALT 28  ALKPHOS 75  BILITOT 0.5  PROT 7.3  ALBUMIN 3.0*   CBC:  Recent Labs Lab 09/18/2013 0939 10/17/13 0512 10/18/13 0356 10/28/2013 0339  WBC 13.2* 16.2* 12.0* 9.3  NEUTROABS 11.3*  --   --   --   HGB 15.3* 14.3 12.7 13.8  HCT 46.0 44.2 39.3 42.0  MCV 92.9 95.5 94.9 92.1  PLT 265 282 254 237   Cardiac Enzymes:  Recent Labs Lab 09/21/2013 1620 10/15/2013 2325 10/17/13 0512  CKTOTAL 46  --   --   TROPONINI <0.30 <0.30 <0.30   Anemia work up  Recent Labs  09/21/2013 1620  TIBC 234*  IRON 28*   Microbiology Recent Results (from the past 240 hour(s))  RAPID STREP SCREEN     Status: None   Collection Time    09/30/2013  9:38 AM      Result Value Range Status   Streptococcus, Group A Screen (Direct) NEGATIVE  NEGATIVE Final   Comment: (NOTE)     A Rapid Antigen test may result negative if the antigen level in the     sample is below the detection level of this test. The FDA has not     cleared this test as a stand-alone test therefore the rapid antigen     negative result has reflexed to a Group A Strep culture.  CULTURE, GROUP A STREP     Status: None   Collection Time    09/27/2013  9:38 AM      Result Value Range Status   Specimen Description THROAT   Final   Special Requests NONE   Final   Culture     Final   Value: No Beta Hemolytic Streptococci Isolated     Performed at [facility]   Report Status 10/18/2013 FINAL   Final  MRSA PCR SCREENING     Status: None   Collection Time    10/02/2013  3:42 PM       Result Value Range Status   MRSA by PCR NEGATIVE  NEGATIVE Final   Comment:            The GeneXpert MRSA Assay (FDA     approved for NASAL specimens     only), is one component of a     comprehensive MRSA colonization     surveillance program. It is not     intended to diagnose MRSA     infection nor to guide or     monitor treatment for     MRSA infections.     Procedures and Diagnostic Studies: Ct Head Wo Contrast  10/12/2013   CLINICAL DATA:  Multiple strokes, aphasia,  demented.  EXAM: CT HEAD WITHOUT CONTRAST  TECHNIQUE: Contiguous axial images were obtained from the base of the skull through the vertex without intravenous contrast.  COMPARISON:  MR 07/04/2013 and earlier studies  FINDINGS: Atherosclerotic and physiologic intracranial calcifications. Diffuse parenchymal atrophy. Patchy areas of hypoattenuation in deep and periventricular white matter left worse than right, slightly progressive since previous exam. Negative for acute intracranial hemorrhage, mass lesion, acute infarction, midline shift, or mass-effect. Acute infarct may be inapparent on noncontrast CT. Ventricles and sulci symmetric. Bone windows demonstrate no focal lesion.  IMPRESSION: 1. Negative for bleed or other acute intracranial process. 2. Atrophy and nonspecific white matter changes as before.   Electronically Signed   By: Arne Cleveland M.D.   On: 10/05/2013 10:50   Dg Chest Portable 1 View  10/12/2013   CLINICAL DATA:  Cough, congestion, hypoxia  EXAM: PORTABLE CHEST - 1 VIEW  COMPARISON:  Portable exam [id number] hr compared to 07/18/2013  FINDINGS: Normal heart size post CABG.  Calcified minimally tortuous thoracic aorta.  Pulmonary vascularity normal.  Chronic interstitial prominence little changed from previous exam.  Minimal scarring at right base stable.  No definite acute infiltrate, pleural effusion or pneumothorax.  Post left mastectomy and axillary node dissection.  Diffuse osseous demineralization.   IMPRESSION: Chronic lung changes as above.  No acute abnormalities.   Electronically Signed   By: Lavonia Dana M.D.   On: 09/19/2013 10:03    Dg Chest Port 1 View  10/17/2013   CLINICAL DATA:  Short of breath.  EXAM: PORTABLE CHEST - 1 VIEW  COMPARISON:  10/16/2008  FINDINGS: Coarse reticular and ill-defined interstitial densities have mildly increased in the lower lungs when compared to the prior exam. No pneumothorax. No convincing pleural effusion. Changes from CABG surgery are stable. Cardiac silhouette is normal in size. Normal mediastinal contour. Dense calcifications noted along the descending thoracic aorta.  Changes from a left mastectomy are stable.  IMPRESSION: Mild increase in lower lung irregular interstitial opacities when compared to the prior exam. This is consistent with patchy areas of infiltrate superimposed on chronic lung changes.  No other change.   Electronically Signed   By: Lajean Manes M.D.   On: 10/17/2013 08:25     Scheduled Meds: . amiodarone  150 mg Intravenous Once  . antiseptic oral rinse  15 mL Mouth Rinse q12n4p  . ceFEPime (MAXIPIME) IV  2 g Intravenous Q24H  . chlorhexidine  15 mL Mouth Rinse BID  . cloNIDine  0.3 mg Transdermal Weekly  . enoxaparin (LOVENOX) injection  40 mg Subcutaneous Q24H  . LORazepam      . metoprolol  5 mg Intravenous Q4H  . sodium chloride  3 mL Intravenous Q12H  . vancomycin  500 mg Intravenous Q12H   Continuous Infusions: . 0.45 % NaCl with KCl 20 mEq / L 100 mL/hr (Nov 10, 2013 0624)  . amiodarone (NEXTERONE PREMIX) 360 mg/200 mL dextrose 30 mg/hr (2013/11/10 0447)    Time spent: 45 minutes with > 50% of time discussing current diagnostic test results, clinical impression and plan of care with the patient's two daughters and sister at the bedside.  The patient is critically ill.    LOS: 3 days   [facility] Pager: [phone] Cell: [phone].   *Please note that the hospitalists switch teams on Wednesdays.  Please call the flow manager at [phone] if you are having difficulty reaching the hospitalist taking care of this patient as she can update you and provide  the most up-to-date pager number of provider caring for the patient. If 8PM-8AM, please contact night-coverage at [email or web address], password [facility]  11/07/2013, 7:46 AM    Please note that Dragon dictation was used in the formation of this note, and may have transcription errors.  This note may not have been transcribed as intended, and may not have been fully proof read for transcription errors upon publication.

## 2013-11-14 NOTE — Consult Note (Signed)
[facility] Liaison: Received request from [place] for family interest in [facility]. Met with two daughters, sister and brother-in-law to confirm interest and complete registration paper work for transfer tomorrow. Discharge summary has been faxed. RN please call report to [phone]. Please arrange transport for patient to arrive at [facility] before noon. Thank you. Erling Conte LCSW [phone]

## 2013-11-14 NOTE — Progress Notes (Signed)
Patient examined, unable to appreciate pulse and heart beat , respirations absent. Pronounced dead at 1545 with Kirkland Hun RN.

## 2013-11-14 NOTE — Progress Notes (Signed)
Wasted 60 ml of  Morphine solution 1mg /ml iv solution, wasted in sink, witnessed by Lottie Dawson

## 2013-11-14 NOTE — Consult Note (Signed)
Palliative Medicine Consult (Full note to follow)  80 yo with advanced progressive dementia with severe behavioral disturbance and aspiration PNA admitted to step down for respiratory failure and severe agitation.  Discussed goals with family- full comfort. They desire for a natural peaceful and dignified death to occur-they describe 6 months of suffering with severe agitation, aggressive behavior and fear.   Patient is current very uncomfortable in bed, connected to monitors, agitated yelling out periodically even after several doses of valium. Family cautions on prior reactions to opiates. She is sensitive to medication in terms of delirium. Haldol not helpful prior to admission even in high doses.  1. Full comfort 2. [facility]- family request [facility]  3. Aggressive symptom management-starting a morphine infusion and scheduled valium.  Will follow.  Lane Hacker, DO Palliative Medicine

## 2013-11-14 NOTE — Progress Notes (Signed)
Nutrition Brief Note  Patient identified for having low braden score.   Wt Readings from Last 3 Encounters:  10/31/2013 141 lb 8.6 oz (64.2 kg)  08/23/13 141 lb (63.957 kg)  07/05/13 146 lb (66.225 kg)    Body mass index is 25.08 kg/(m^2). Patient meets criteria for overweight based on current BMI.   Current diet order is NPO. Pt with dementia admitted with altered mental status. Shortly after admission, the patient went into atrial fibrillation with rapid ventricular response and developed a worsening cough and acute respiratory failure requiring Bipap, secondary to aspiration PNA. Palliative care following and plan is for full comfort. Recommend comfort feeds per pt and family wishes.   No nutrition interventions warranted at this time. If nutrition issues arise, please consult RD.   Mikey College MS, [place], [place] Pager [phone] After Hours Pager

## 2013-11-14 DEATH — deceased

## 2013-11-22 ENCOUNTER — Encounter: Payer: Self-pay | Admitting: Internal Medicine

## 2014-03-26 IMAGING — CR DG CHEST 2V
2 series · 2 of 2 positions shown · non-contrast
Comparison: 01/29/2008

CLINICAL DATA: Shortness of breath. Dyspnea. Hypertension. Previous
myocardial infarction. Coronary artery disease.

EXAM:
CHEST  2 VIEW

[w chest pa]
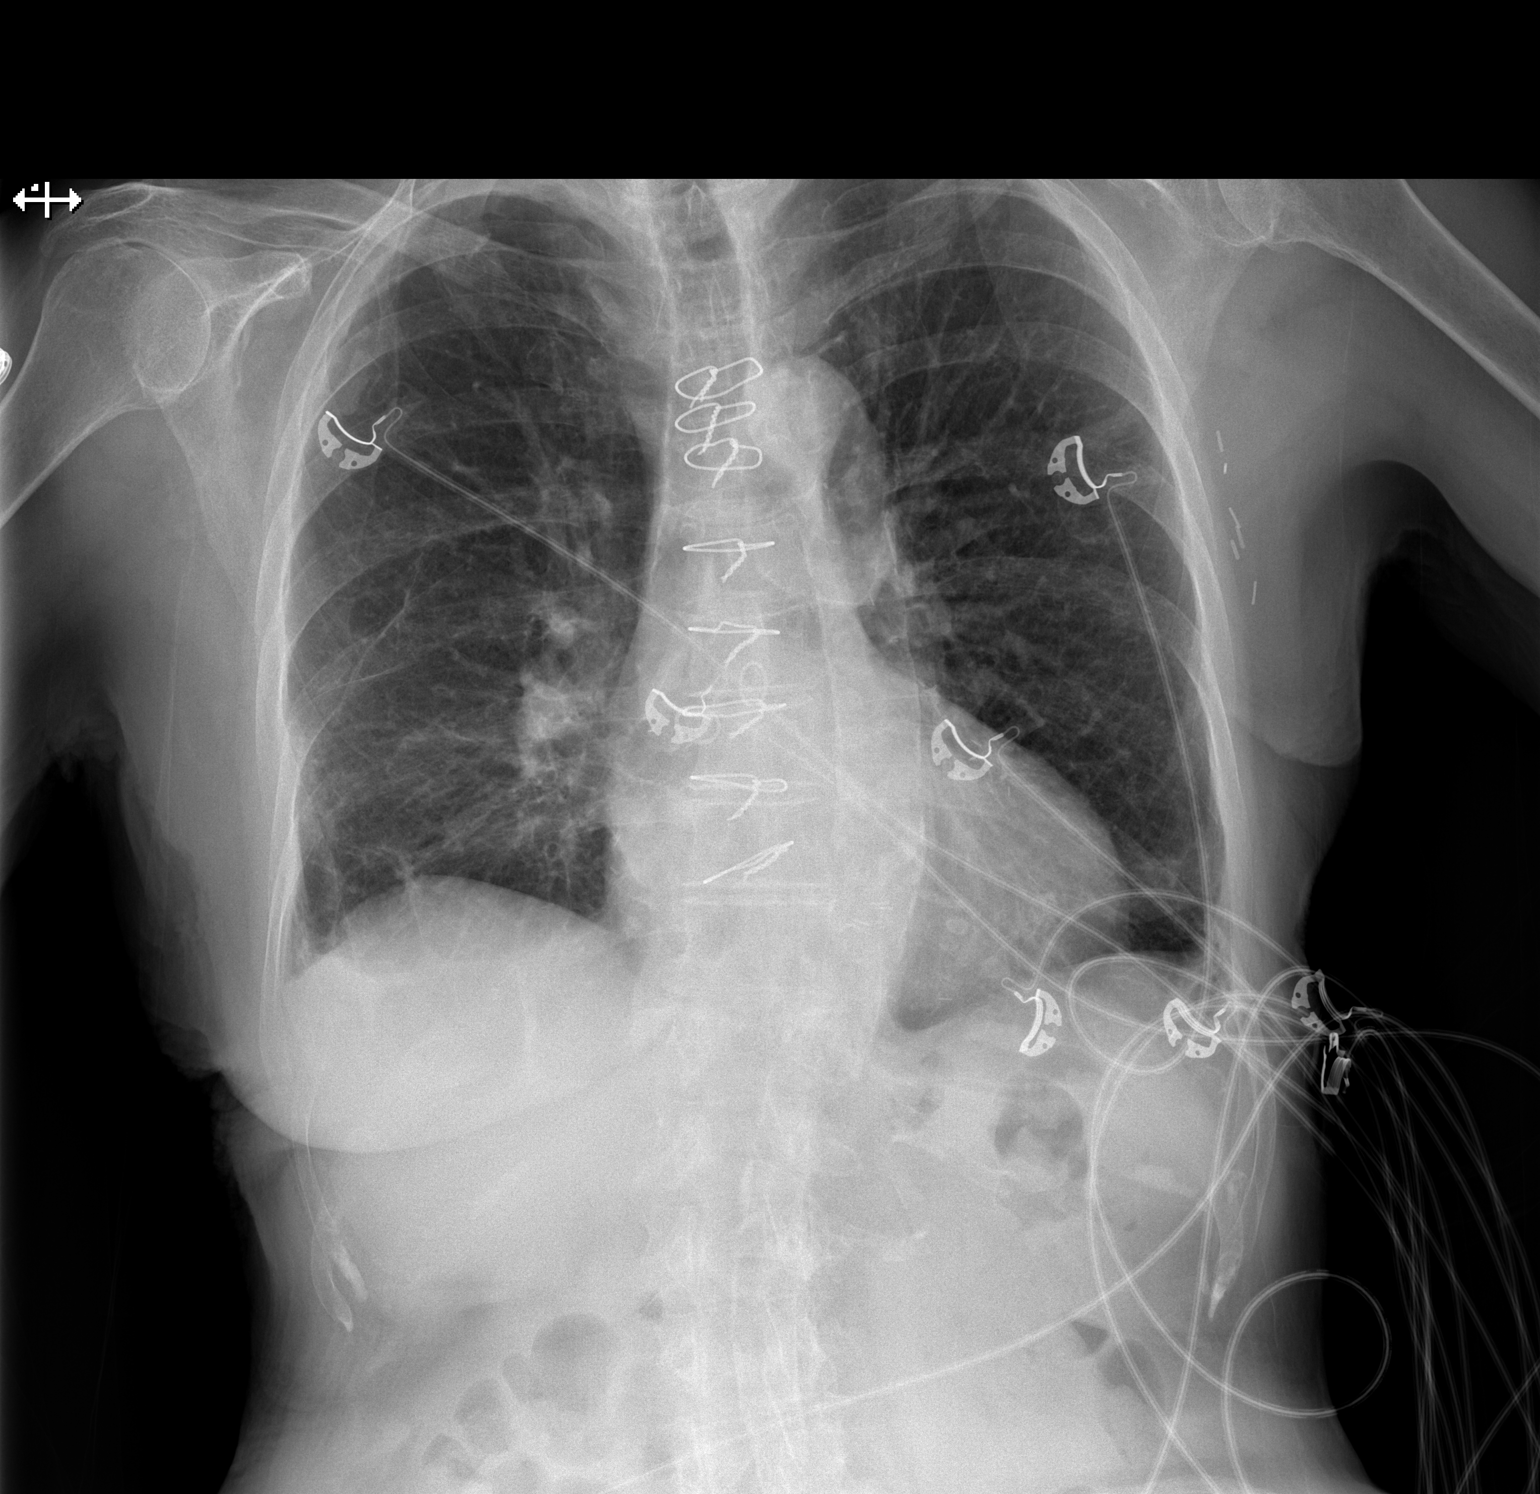

[w chest lat]
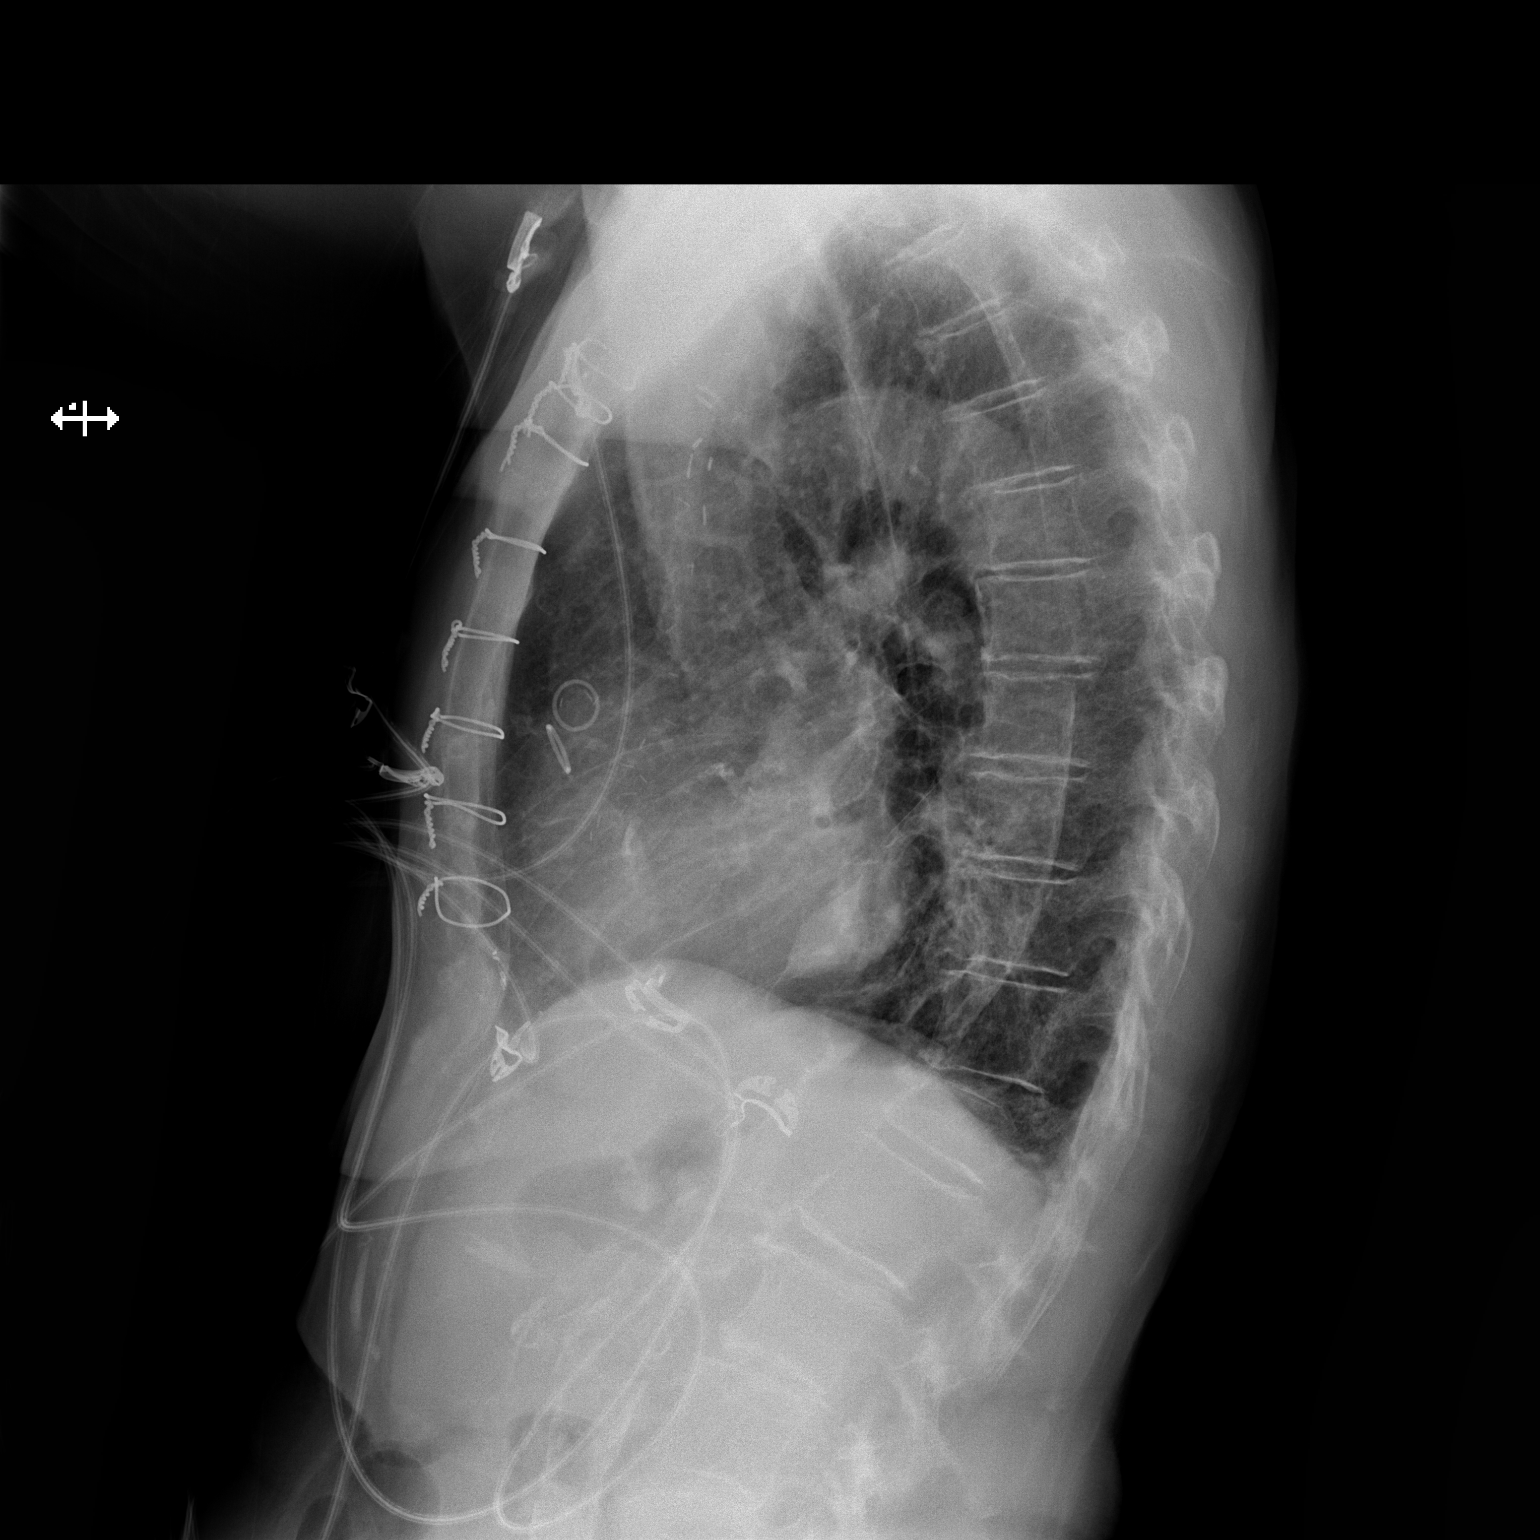

[2 of 2 positions shown; findings below may reference images not displayed]

FINDINGS: Mild cardiomegaly remains stable. Right lower lung scarring is
unchanged. No evidence of pulmonary infiltrate or edema. No evidence
of pleural effusion. No mass or lymphadenopathy identified.

Patient has undergone post prior CABG, left cystectomy, and axillary
lymph node dissection. T12 vertebral body compression fracture is
seen, which is of indeterminate age radiographically but is new
since 8889 exam.
IMPRESSION: Mild cardiomegaly and right basilar scarring. No active lung
disease.

T12 vertebral body compression fracture of indeterminate age
radiographically.
